# Patient Record
Sex: Female | Born: 1955
Health system: Southern US, Community
[De-identification: ages and names within clinical notes are randomized; demographics above are authoritative.]

## PROBLEM LIST (undated history)

## (undated) DIAGNOSIS — N289 Disorder of kidney and ureter, unspecified: Secondary | ICD-10-CM

---

## 2019-11-24 ENCOUNTER — Other Ambulatory Visit: Payer: Self-pay | Admitting: Internal Medicine

## 2019-11-24 DIAGNOSIS — R030 Elevated blood-pressure reading, without diagnosis of hypertension: Secondary | ICD-10-CM | POA: Diagnosis not present

## 2019-11-24 DIAGNOSIS — Z1231 Encounter for screening mammogram for malignant neoplasm of breast: Secondary | ICD-10-CM | POA: Diagnosis not present

## 2019-11-24 DIAGNOSIS — Z1159 Encounter for screening for other viral diseases: Secondary | ICD-10-CM | POA: Diagnosis not present

## 2019-11-24 DIAGNOSIS — Z114 Encounter for screening for human immunodeficiency virus [HIV]: Secondary | ICD-10-CM | POA: Diagnosis not present

## 2019-11-24 DIAGNOSIS — Z1239 Encounter for other screening for malignant neoplasm of breast: Secondary | ICD-10-CM | POA: Diagnosis not present

## 2019-12-13 ENCOUNTER — Other Ambulatory Visit: Payer: Self-pay

## 2019-12-13 ENCOUNTER — Ambulatory Visit
Admission: RE | Admit: 2019-12-13 | Discharge: 2019-12-13 | Disposition: A | Discharge status: Home / Self Care | Payer: Medicare HMO | Source: Ambulatory Visit | Attending: Internal Medicine | Admitting: Internal Medicine

## 2019-12-13 ENCOUNTER — Encounter (INDEPENDENT_AMBULATORY_CARE_PROVIDER_SITE_OTHER): Payer: Self-pay

## 2019-12-13 DIAGNOSIS — Z1231 Encounter for screening mammogram for malignant neoplasm of breast: Secondary | ICD-10-CM | POA: Diagnosis not present

## 2020-03-08 DIAGNOSIS — Z1211 Encounter for screening for malignant neoplasm of colon: Secondary | ICD-10-CM | POA: Diagnosis not present

## 2020-03-08 DIAGNOSIS — Z0000 Encounter for general adult medical examination without abnormal findings: Secondary | ICD-10-CM | POA: Diagnosis not present

## 2020-03-08 DIAGNOSIS — R030 Elevated blood-pressure reading, without diagnosis of hypertension: Secondary | ICD-10-CM | POA: Diagnosis not present

## 2020-03-08 DIAGNOSIS — Z23 Encounter for immunization: Secondary | ICD-10-CM | POA: Diagnosis not present

## 2020-03-08 DIAGNOSIS — M170 Bilateral primary osteoarthritis of knee: Secondary | ICD-10-CM | POA: Diagnosis not present

## 2020-04-02 DIAGNOSIS — L853 Xerosis cutis: Secondary | ICD-10-CM | POA: Diagnosis not present

## 2020-04-02 DIAGNOSIS — R600 Localized edema: Secondary | ICD-10-CM | POA: Diagnosis not present

## 2020-05-03 DIAGNOSIS — T7840XA Allergy, unspecified, initial encounter: Secondary | ICD-10-CM | POA: Diagnosis not present

## 2020-05-03 DIAGNOSIS — L219 Seborrheic dermatitis, unspecified: Secondary | ICD-10-CM | POA: Diagnosis not present

## 2020-05-07 DIAGNOSIS — M170 Bilateral primary osteoarthritis of knee: Secondary | ICD-10-CM | POA: Insufficient documentation

## 2021-03-11 DIAGNOSIS — M170 Bilateral primary osteoarthritis of knee: Secondary | ICD-10-CM | POA: Diagnosis not present

## 2021-03-11 DIAGNOSIS — Z13820 Encounter for screening for osteoporosis: Secondary | ICD-10-CM | POA: Diagnosis not present

## 2021-03-11 DIAGNOSIS — Z1321 Encounter for screening for nutritional disorder: Secondary | ICD-10-CM | POA: Diagnosis not present

## 2021-03-11 DIAGNOSIS — Z1329 Encounter for screening for other suspected endocrine disorder: Secondary | ICD-10-CM | POA: Diagnosis not present

## 2021-03-11 DIAGNOSIS — Z131 Encounter for screening for diabetes mellitus: Secondary | ICD-10-CM | POA: Diagnosis not present

## 2021-03-11 DIAGNOSIS — Z0000 Encounter for general adult medical examination without abnormal findings: Secondary | ICD-10-CM | POA: Diagnosis not present

## 2021-03-11 DIAGNOSIS — Z23 Encounter for immunization: Secondary | ICD-10-CM | POA: Diagnosis not present

## 2021-03-11 DIAGNOSIS — Z13220 Encounter for screening for lipoid disorders: Secondary | ICD-10-CM | POA: Diagnosis not present

## 2021-04-25 DIAGNOSIS — R7989 Other specified abnormal findings of blood chemistry: Secondary | ICD-10-CM | POA: Diagnosis not present

## 2021-05-21 ENCOUNTER — Other Ambulatory Visit: Payer: Self-pay

## 2021-05-21 ENCOUNTER — Ambulatory Visit
Admission: EM | Admit: 2021-05-21 | Discharge: 2021-05-21 | Disposition: A | Discharge status: Home / Self Care | Payer: Medicare HMO | Attending: Emergency Medicine | Admitting: Emergency Medicine

## 2021-05-21 DIAGNOSIS — T3110 Burns involving 10-19% of body surface with 0% to 9% third degree burns: Secondary | ICD-10-CM

## 2021-05-21 MED ORDER — SILVER SULFADIAZINE 1 % EX CREA: 1.0000 "application " | TOPICAL_CREAM | Freq: Every day | CUTANEOUS | 0 refills | Status: DC

## 2021-05-21 MED ORDER — CEPHALEXIN 500 MG PO CAPS: 500.0000 mg | ORAL_CAPSULE | Freq: Four times a day (QID) | ORAL | 0 refills | Status: DC

## 2021-05-21 MED ORDER — SILVER SULFADIAZINE 1 % EX CREA
TOPICAL_CREAM | Freq: Two times a day (BID) | CUTANEOUS | Status: DC
Start: 2021-05-21 — End: 2021-05-21

## 2021-05-21 NOTE — Discharge Instructions (Signed)
We discussed that you need to follow-up with your PCP in the next 24 to 48 hours for follow-up of wound care.  We will place you on an antibiotic to prevent any infection You will need to wash and dry area well apply Silvadene cream a thin layer twice a day and keep area covered. Do not pop the blisters as these may drain on their own If symptoms become worse you will need to be seen in the emergency room
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
## 2021-05-23 DIAGNOSIS — T24211D Burn of second degree of right thigh, subsequent encounter: Secondary | ICD-10-CM | POA: Diagnosis not present

## 2021-05-23 DIAGNOSIS — Z13820 Encounter for screening for osteoporosis: Secondary | ICD-10-CM | POA: Diagnosis not present

## 2021-05-23 DIAGNOSIS — T24212D Burn of second degree of left thigh, subsequent encounter: Secondary | ICD-10-CM | POA: Diagnosis not present

## 2021-05-30 DIAGNOSIS — T24211D Burn of second degree of right thigh, subsequent encounter: Secondary | ICD-10-CM | POA: Diagnosis not present

## 2021-05-30 DIAGNOSIS — T24212D Burn of second degree of left thigh, subsequent encounter: Secondary | ICD-10-CM | POA: Diagnosis not present

## 2021-06-06 DIAGNOSIS — T24212D Burn of second degree of left thigh, subsequent encounter: Secondary | ICD-10-CM | POA: Diagnosis not present

## 2021-06-06 DIAGNOSIS — Z5189 Encounter for other specified aftercare: Secondary | ICD-10-CM | POA: Diagnosis not present

## 2021-06-06 DIAGNOSIS — T24211D Burn of second degree of right thigh, subsequent encounter: Secondary | ICD-10-CM | POA: Diagnosis not present

## 2021-06-13 DIAGNOSIS — T24211D Burn of second degree of right thigh, subsequent encounter: Secondary | ICD-10-CM | POA: Diagnosis not present

## 2021-06-13 DIAGNOSIS — T24212D Burn of second degree of left thigh, subsequent encounter: Secondary | ICD-10-CM | POA: Diagnosis not present

## 2021-06-13 DIAGNOSIS — Z5189 Encounter for other specified aftercare: Secondary | ICD-10-CM | POA: Diagnosis not present

## 2021-06-25 DIAGNOSIS — R7989 Other specified abnormal findings of blood chemistry: Secondary | ICD-10-CM | POA: Diagnosis not present

## 2021-06-25 DIAGNOSIS — T24211A Burn of second degree of right thigh, initial encounter: Secondary | ICD-10-CM | POA: Diagnosis not present

## 2021-06-25 DIAGNOSIS — Z5189 Encounter for other specified aftercare: Secondary | ICD-10-CM | POA: Diagnosis not present

## 2021-08-01 DIAGNOSIS — M8588 Other specified disorders of bone density and structure, other site: Secondary | ICD-10-CM | POA: Diagnosis not present

## 2021-08-04 DIAGNOSIS — M8580 Other specified disorders of bone density and structure, unspecified site: Secondary | ICD-10-CM | POA: Insufficient documentation

## 2021-12-05 ENCOUNTER — Other Ambulatory Visit: Payer: Self-pay | Admitting: Gerontology

## 2021-12-05 DIAGNOSIS — Z1231 Encounter for screening mammogram for malignant neoplasm of breast: Secondary | ICD-10-CM

## 2021-12-20 IMAGING — MG DIGITAL SCREENING BILAT W/ TOMO W/ CAD
8 series · 8 of 24 positions shown · non-contrast
Comparison: None.

CLINICAL DATA: Screening.

EXAM:
DIGITAL SCREENING BILATERAL MAMMOGRAM WITH TOMO AND CAD

[L MLO synth-2D]
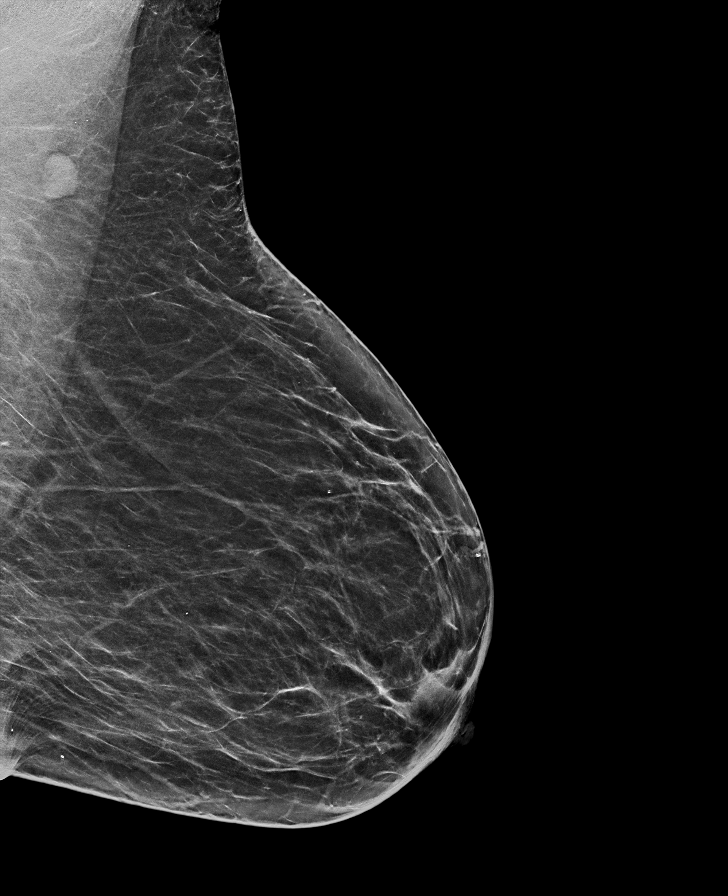

[L CC synth-2D]
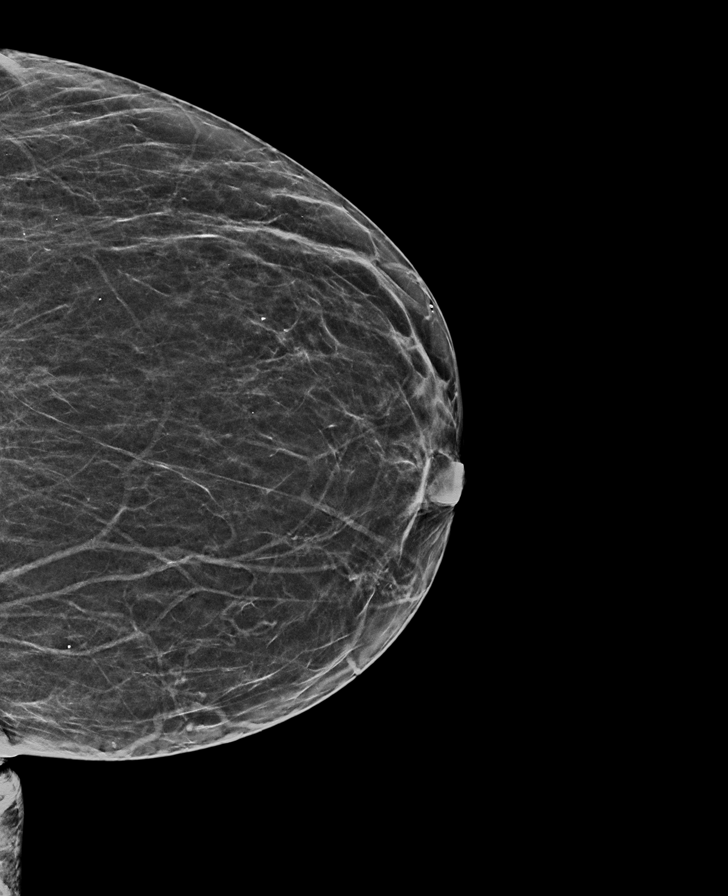

[R MLO synth-2D]
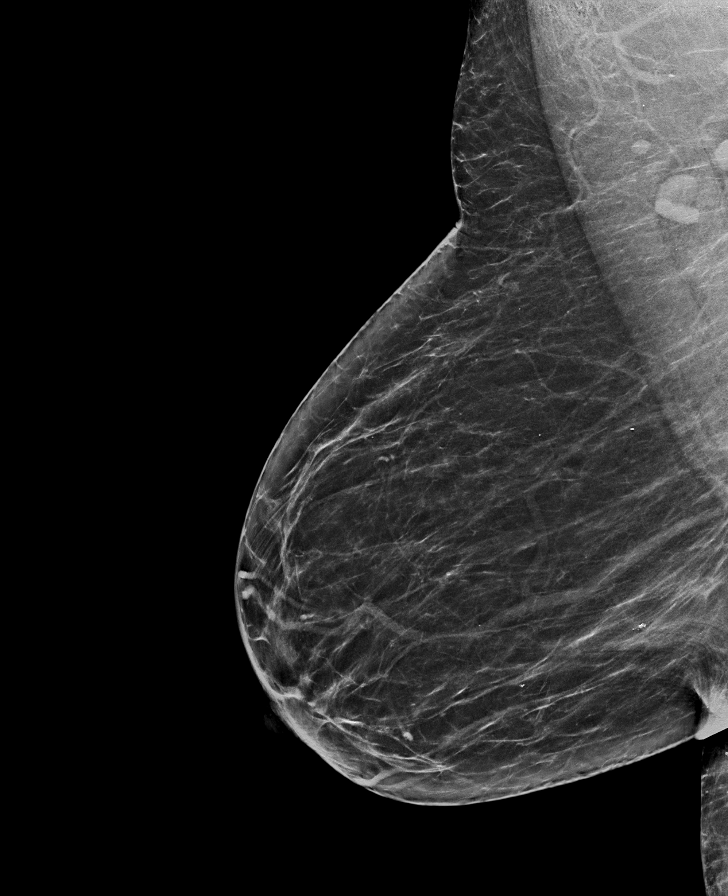

[R CC synth-2D]
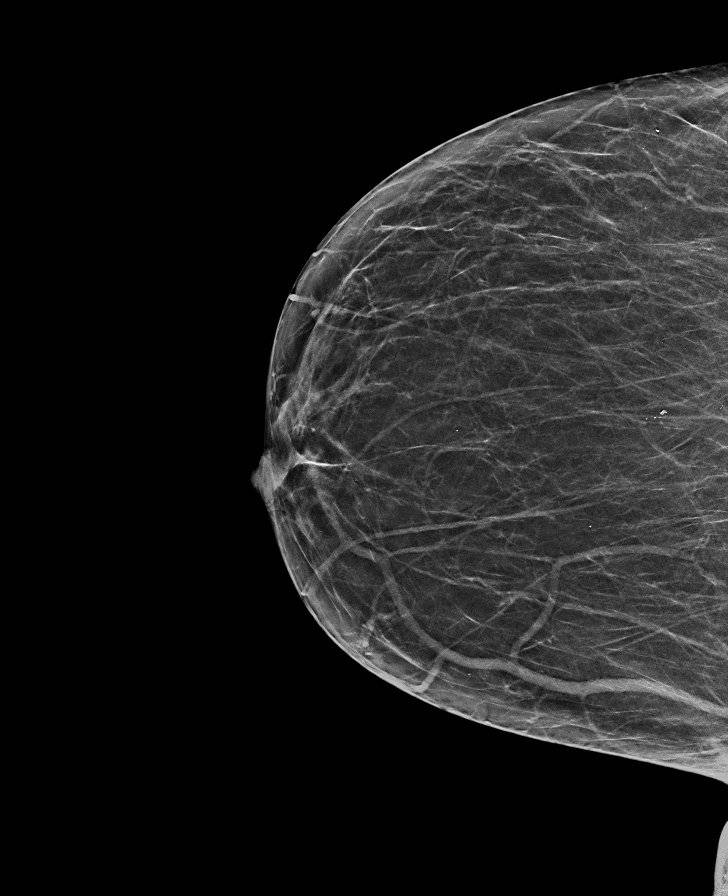

[L MLO tomo · tomo slice 34/67.0]
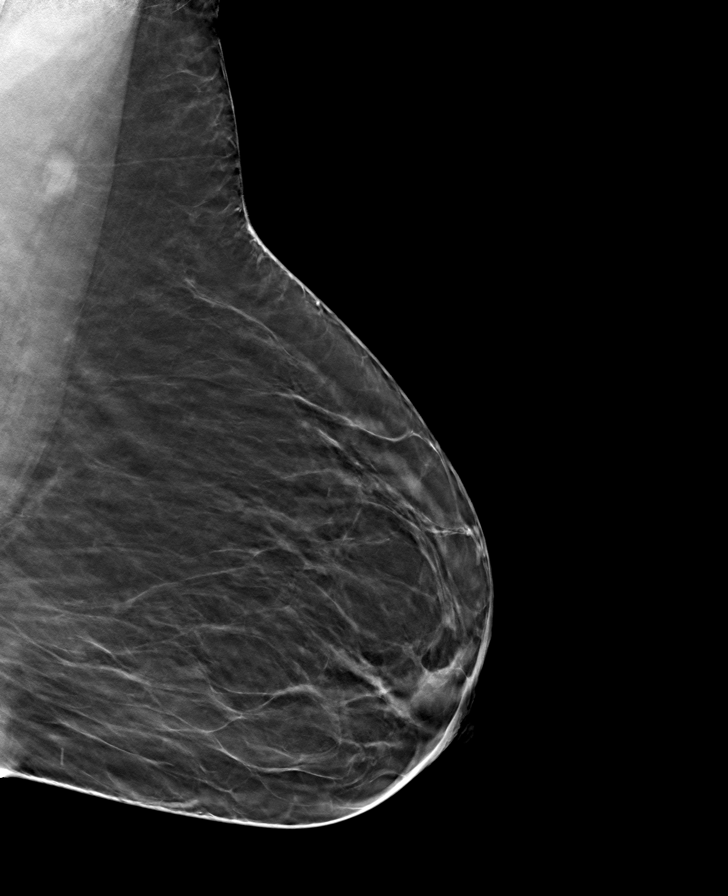

[R MLO tomo · tomo slice 36/71.0]
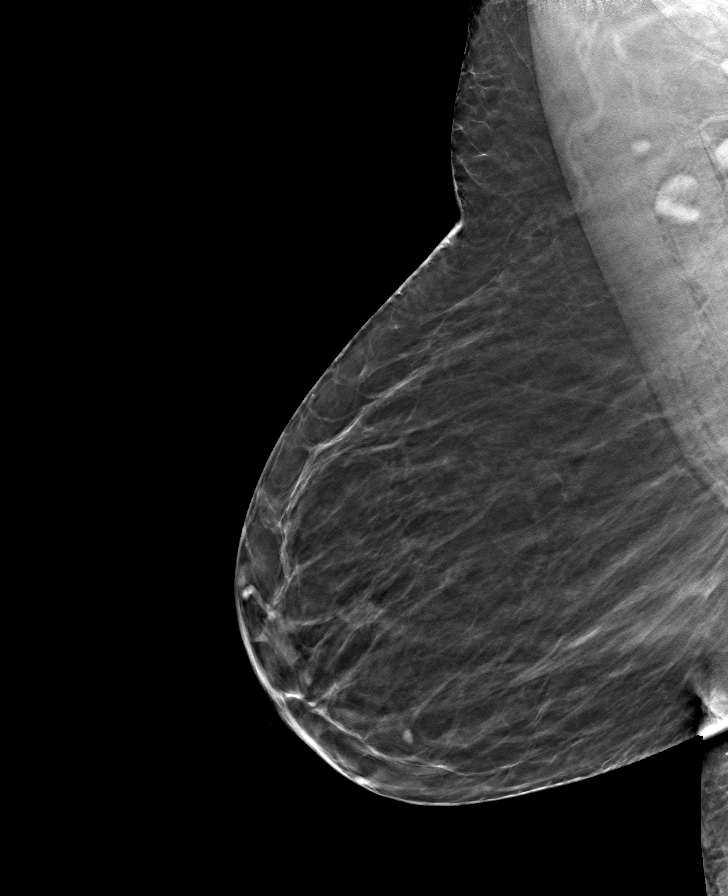

[R CC tomo · tomo slice 29/56.0]
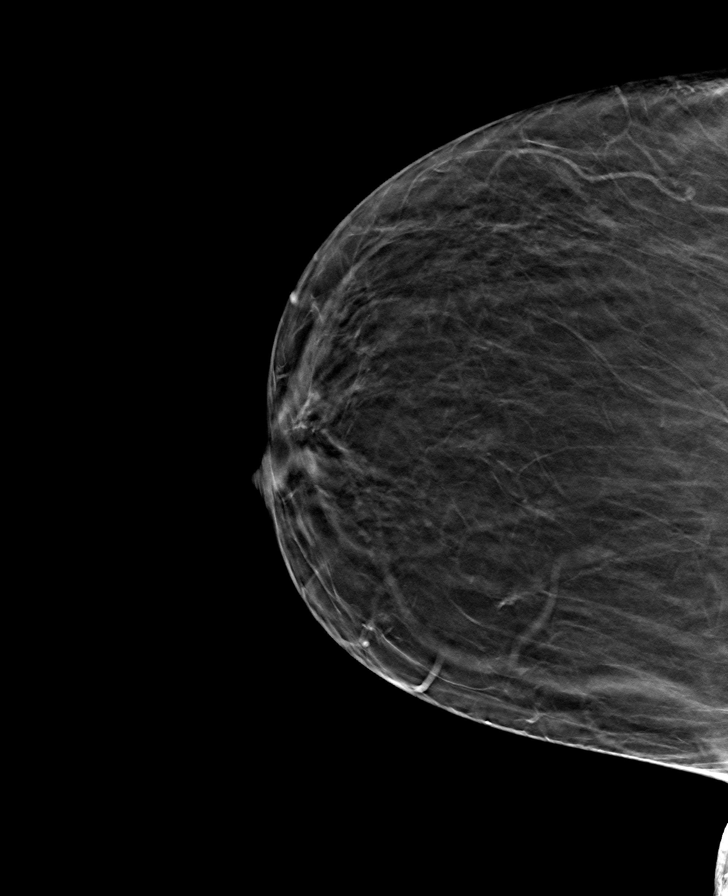

[L CC tomo · tomo slice 28/55.0]
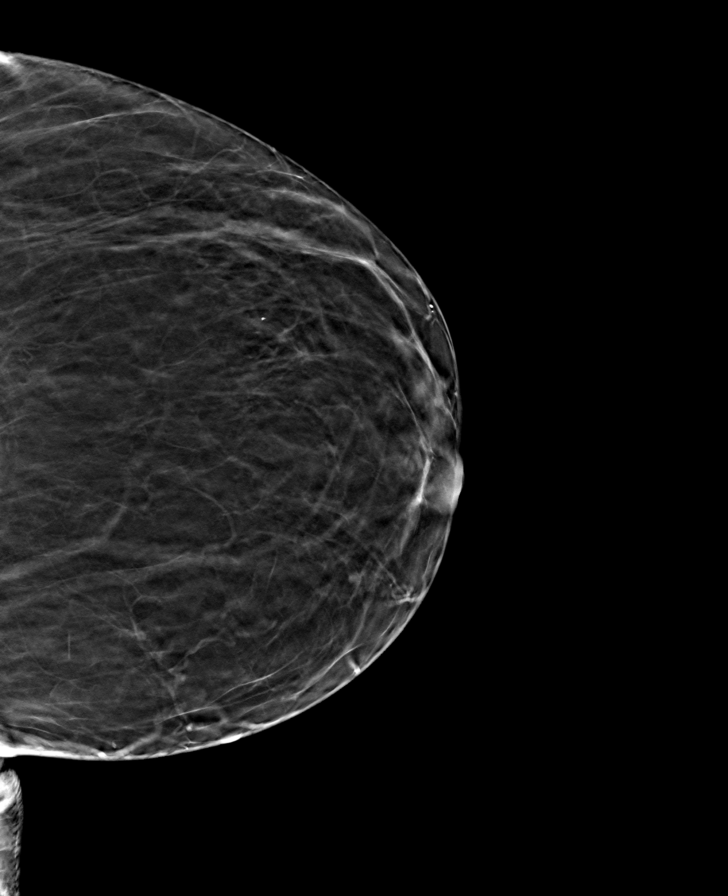

[8 of 24 positions shown; findings below may reference images not displayed]

ACR Breast Density Category b: There are scattered areas of
fibroglandular density.
FINDINGS: There are no findings suspicious for malignancy. Images were
processed with CAD.
IMPRESSION: No mammographic evidence of malignancy. A result letter of this
screening mammogram will be mailed directly to the patient.

RECOMMENDATION:
Screening mammogram in one year. (Code:Y5-G-EJ6)

BI-RADS CATEGORY  1: Negative.

## 2021-12-31 ENCOUNTER — Ambulatory Visit
Admission: RE | Admit: 2021-12-31 | Discharge: 2021-12-31 | Disposition: A | Discharge status: Home / Self Care | Payer: Medicare HMO | Source: Ambulatory Visit | Attending: Gerontology | Admitting: Gerontology

## 2021-12-31 DIAGNOSIS — Z1231 Encounter for screening mammogram for malignant neoplasm of breast: Secondary | ICD-10-CM | POA: Insufficient documentation

## 2022-09-25 DIAGNOSIS — Z1231 Encounter for screening mammogram for malignant neoplasm of breast: Secondary | ICD-10-CM | POA: Diagnosis not present

## 2022-09-25 DIAGNOSIS — Z131 Encounter for screening for diabetes mellitus: Secondary | ICD-10-CM | POA: Diagnosis not present

## 2022-09-25 DIAGNOSIS — Z13220 Encounter for screening for lipoid disorders: Secondary | ICD-10-CM | POA: Diagnosis not present

## 2022-09-25 DIAGNOSIS — M170 Bilateral primary osteoarthritis of knee: Secondary | ICD-10-CM | POA: Diagnosis not present

## 2022-09-25 DIAGNOSIS — M8588 Other specified disorders of bone density and structure, other site: Secondary | ICD-10-CM | POA: Diagnosis not present

## 2022-09-25 DIAGNOSIS — Z1329 Encounter for screening for other suspected endocrine disorder: Secondary | ICD-10-CM | POA: Diagnosis not present

## 2022-09-25 DIAGNOSIS — M8580 Other specified disorders of bone density and structure, unspecified site: Secondary | ICD-10-CM | POA: Diagnosis not present

## 2022-09-25 DIAGNOSIS — Z0000 Encounter for general adult medical examination without abnormal findings: Secondary | ICD-10-CM | POA: Diagnosis not present

## 2022-12-29 ENCOUNTER — Other Ambulatory Visit: Payer: Self-pay | Admitting: Gerontology

## 2022-12-29 DIAGNOSIS — Z1231 Encounter for screening mammogram for malignant neoplasm of breast: Secondary | ICD-10-CM

## 2023-01-28 ENCOUNTER — Ambulatory Visit
Admission: RE | Admit: 2023-01-28 | Discharge: 2023-01-28 | Disposition: A | Discharge status: Home / Self Care | Payer: Medicare HMO | Source: Ambulatory Visit | Attending: Gerontology | Admitting: Gerontology

## 2023-01-28 DIAGNOSIS — Z1231 Encounter for screening mammogram for malignant neoplasm of breast: Secondary | ICD-10-CM | POA: Insufficient documentation

## 2023-07-22 DIAGNOSIS — H524 Presbyopia: Secondary | ICD-10-CM | POA: Diagnosis not present

## 2023-10-01 DIAGNOSIS — M170 Bilateral primary osteoarthritis of knee: Secondary | ICD-10-CM | POA: Diagnosis not present

## 2023-10-01 DIAGNOSIS — Z13220 Encounter for screening for lipoid disorders: Secondary | ICD-10-CM | POA: Diagnosis not present

## 2023-10-01 DIAGNOSIS — Z1329 Encounter for screening for other suspected endocrine disorder: Secondary | ICD-10-CM | POA: Diagnosis not present

## 2023-10-01 DIAGNOSIS — R234 Changes in skin texture: Secondary | ICD-10-CM | POA: Insufficient documentation

## 2023-10-01 DIAGNOSIS — M8580 Other specified disorders of bone density and structure, unspecified site: Secondary | ICD-10-CM | POA: Diagnosis not present

## 2023-10-01 DIAGNOSIS — Z131 Encounter for screening for diabetes mellitus: Secondary | ICD-10-CM | POA: Diagnosis not present

## 2023-10-01 DIAGNOSIS — K649 Unspecified hemorrhoids: Secondary | ICD-10-CM | POA: Diagnosis not present

## 2023-10-01 DIAGNOSIS — Z1331 Encounter for screening for depression: Secondary | ICD-10-CM | POA: Diagnosis not present

## 2023-10-01 DIAGNOSIS — Z0000 Encounter for general adult medical examination without abnormal findings: Secondary | ICD-10-CM | POA: Diagnosis not present

## 2023-10-13 DIAGNOSIS — Z1329 Encounter for screening for other suspected endocrine disorder: Secondary | ICD-10-CM | POA: Diagnosis not present

## 2023-10-13 DIAGNOSIS — Z13220 Encounter for screening for lipoid disorders: Secondary | ICD-10-CM | POA: Diagnosis not present

## 2023-10-13 DIAGNOSIS — M8580 Other specified disorders of bone density and structure, unspecified site: Secondary | ICD-10-CM | POA: Diagnosis not present

## 2023-10-13 DIAGNOSIS — Z131 Encounter for screening for diabetes mellitus: Secondary | ICD-10-CM | POA: Diagnosis not present

## 2023-12-26 DIAGNOSIS — R279 Unspecified lack of coordination: Secondary | ICD-10-CM | POA: Diagnosis not present

## 2023-12-26 DIAGNOSIS — E538 Deficiency of other specified B group vitamins: Secondary | ICD-10-CM | POA: Diagnosis not present

## 2023-12-26 DIAGNOSIS — G8929 Other chronic pain: Secondary | ICD-10-CM | POA: Diagnosis not present

## 2023-12-26 DIAGNOSIS — M6281 Muscle weakness (generalized): Secondary | ICD-10-CM | POA: Diagnosis not present

## 2023-12-26 DIAGNOSIS — M25551 Pain in right hip: Secondary | ICD-10-CM | POA: Insufficient documentation

## 2023-12-26 DIAGNOSIS — R41841 Cognitive communication deficit: Secondary | ICD-10-CM | POA: Diagnosis not present

## 2023-12-26 DIAGNOSIS — R609 Edema, unspecified: Secondary | ICD-10-CM | POA: Diagnosis not present

## 2023-12-26 DIAGNOSIS — B9620 Unspecified Escherichia coli [E. coli] as the cause of diseases classified elsewhere: Secondary | ICD-10-CM | POA: Diagnosis not present

## 2023-12-26 DIAGNOSIS — R269 Unspecified abnormalities of gait and mobility: Secondary | ICD-10-CM | POA: Diagnosis not present

## 2023-12-26 DIAGNOSIS — M79604 Pain in right leg: Secondary | ICD-10-CM | POA: Diagnosis not present

## 2023-12-26 DIAGNOSIS — N3001 Acute cystitis with hematuria: Secondary | ICD-10-CM | POA: Diagnosis not present

## 2023-12-26 DIAGNOSIS — R262 Difficulty in walking, not elsewhere classified: Secondary | ICD-10-CM | POA: Diagnosis not present

## 2023-12-26 DIAGNOSIS — R5381 Other malaise: Secondary | ICD-10-CM | POA: Diagnosis not present

## 2023-12-26 DIAGNOSIS — N390 Urinary tract infection, site not specified: Secondary | ICD-10-CM | POA: Diagnosis not present

## 2023-12-26 DIAGNOSIS — Z1152 Encounter for screening for COVID-19: Secondary | ICD-10-CM | POA: Diagnosis not present

## 2023-12-26 DIAGNOSIS — K5903 Drug induced constipation: Secondary | ICD-10-CM | POA: Diagnosis not present

## 2023-12-26 DIAGNOSIS — D72829 Elevated white blood cell count, unspecified: Secondary | ICD-10-CM | POA: Diagnosis not present

## 2023-12-26 DIAGNOSIS — N3000 Acute cystitis without hematuria: Secondary | ICD-10-CM | POA: Diagnosis not present

## 2023-12-26 DIAGNOSIS — R1311 Dysphagia, oral phase: Secondary | ICD-10-CM | POA: Diagnosis not present

## 2023-12-26 DIAGNOSIS — B356 Tinea cruris: Secondary | ICD-10-CM | POA: Diagnosis not present

## 2023-12-26 DIAGNOSIS — M1611 Unilateral primary osteoarthritis, right hip: Secondary | ICD-10-CM | POA: Diagnosis not present

## 2023-12-27 DIAGNOSIS — R609 Edema, unspecified: Secondary | ICD-10-CM | POA: Diagnosis not present

## 2023-12-27 DIAGNOSIS — D72829 Elevated white blood cell count, unspecified: Secondary | ICD-10-CM | POA: Diagnosis not present

## 2023-12-27 DIAGNOSIS — M25551 Pain in right hip: Secondary | ICD-10-CM | POA: Diagnosis not present

## 2023-12-27 DIAGNOSIS — M1611 Unilateral primary osteoarthritis, right hip: Secondary | ICD-10-CM | POA: Diagnosis not present

## 2023-12-28 DIAGNOSIS — M25551 Pain in right hip: Secondary | ICD-10-CM | POA: Diagnosis not present

## 2023-12-28 DIAGNOSIS — N390 Urinary tract infection, site not specified: Secondary | ICD-10-CM | POA: Diagnosis not present

## 2023-12-28 DIAGNOSIS — D72829 Elevated white blood cell count, unspecified: Secondary | ICD-10-CM | POA: Diagnosis not present

## 2023-12-29 DIAGNOSIS — M79604 Pain in right leg: Secondary | ICD-10-CM | POA: Diagnosis not present

## 2023-12-29 DIAGNOSIS — D72829 Elevated white blood cell count, unspecified: Secondary | ICD-10-CM | POA: Diagnosis not present

## 2023-12-29 DIAGNOSIS — M25551 Pain in right hip: Secondary | ICD-10-CM | POA: Diagnosis not present

## 2023-12-30 DIAGNOSIS — M25551 Pain in right hip: Secondary | ICD-10-CM | POA: Diagnosis not present

## 2023-12-31 DIAGNOSIS — M1611 Unilateral primary osteoarthritis, right hip: Secondary | ICD-10-CM | POA: Diagnosis not present

## 2023-12-31 DIAGNOSIS — M25551 Pain in right hip: Secondary | ICD-10-CM | POA: Diagnosis not present

## 2024-01-01 DIAGNOSIS — M25551 Pain in right hip: Secondary | ICD-10-CM | POA: Diagnosis not present

## 2024-01-02 DIAGNOSIS — M25551 Pain in right hip: Secondary | ICD-10-CM | POA: Diagnosis not present

## 2024-01-03 DIAGNOSIS — M25551 Pain in right hip: Secondary | ICD-10-CM | POA: Diagnosis not present

## 2024-01-03 DIAGNOSIS — D72829 Elevated white blood cell count, unspecified: Secondary | ICD-10-CM | POA: Diagnosis not present

## 2024-01-03 DIAGNOSIS — G8929 Other chronic pain: Secondary | ICD-10-CM | POA: Diagnosis not present

## 2024-01-04 DIAGNOSIS — D72829 Elevated white blood cell count, unspecified: Secondary | ICD-10-CM | POA: Diagnosis not present

## 2024-01-04 DIAGNOSIS — G8929 Other chronic pain: Secondary | ICD-10-CM | POA: Diagnosis not present

## 2024-01-04 DIAGNOSIS — M25551 Pain in right hip: Secondary | ICD-10-CM | POA: Diagnosis not present

## 2024-01-05 DIAGNOSIS — D72829 Elevated white blood cell count, unspecified: Secondary | ICD-10-CM | POA: Diagnosis not present

## 2024-01-05 DIAGNOSIS — G8929 Other chronic pain: Secondary | ICD-10-CM | POA: Diagnosis not present

## 2024-01-05 DIAGNOSIS — M25551 Pain in right hip: Secondary | ICD-10-CM | POA: Diagnosis not present

## 2024-01-06 DIAGNOSIS — E559 Vitamin D deficiency, unspecified: Secondary | ICD-10-CM | POA: Diagnosis not present

## 2024-01-06 DIAGNOSIS — R279 Unspecified lack of coordination: Secondary | ICD-10-CM | POA: Diagnosis not present

## 2024-01-06 DIAGNOSIS — R1311 Dysphagia, oral phase: Secondary | ICD-10-CM | POA: Diagnosis not present

## 2024-01-06 DIAGNOSIS — R5381 Other malaise: Secondary | ICD-10-CM | POA: Diagnosis not present

## 2024-01-06 DIAGNOSIS — M1611 Unilateral primary osteoarthritis, right hip: Secondary | ICD-10-CM | POA: Diagnosis not present

## 2024-01-06 DIAGNOSIS — R262 Difficulty in walking, not elsewhere classified: Secondary | ICD-10-CM | POA: Diagnosis not present

## 2024-01-06 DIAGNOSIS — Z7189 Other specified counseling: Secondary | ICD-10-CM | POA: Diagnosis not present

## 2024-01-06 DIAGNOSIS — G4700 Insomnia, unspecified: Secondary | ICD-10-CM | POA: Diagnosis not present

## 2024-01-06 DIAGNOSIS — K5900 Constipation, unspecified: Secondary | ICD-10-CM | POA: Diagnosis not present

## 2024-01-06 DIAGNOSIS — R269 Unspecified abnormalities of gait and mobility: Secondary | ICD-10-CM | POA: Diagnosis not present

## 2024-01-06 DIAGNOSIS — Z87448 Personal history of other diseases of urinary system: Secondary | ICD-10-CM | POA: Diagnosis not present

## 2024-01-06 DIAGNOSIS — E538 Deficiency of other specified B group vitamins: Secondary | ICD-10-CM | POA: Diagnosis not present

## 2024-01-06 DIAGNOSIS — D72829 Elevated white blood cell count, unspecified: Secondary | ICD-10-CM | POA: Diagnosis not present

## 2024-01-06 DIAGNOSIS — R41841 Cognitive communication deficit: Secondary | ICD-10-CM | POA: Diagnosis not present

## 2024-01-06 DIAGNOSIS — B356 Tinea cruris: Secondary | ICD-10-CM | POA: Diagnosis not present

## 2024-01-06 DIAGNOSIS — N390 Urinary tract infection, site not specified: Secondary | ICD-10-CM | POA: Diagnosis not present

## 2024-01-06 DIAGNOSIS — M6281 Muscle weakness (generalized): Secondary | ICD-10-CM | POA: Diagnosis not present

## 2024-01-06 DIAGNOSIS — M25551 Pain in right hip: Secondary | ICD-10-CM | POA: Diagnosis not present

## 2024-01-06 DIAGNOSIS — N3000 Acute cystitis without hematuria: Secondary | ICD-10-CM | POA: Diagnosis not present

## 2024-01-07 DIAGNOSIS — Z7189 Other specified counseling: Secondary | ICD-10-CM | POA: Diagnosis not present

## 2024-01-07 DIAGNOSIS — N3000 Acute cystitis without hematuria: Secondary | ICD-10-CM | POA: Diagnosis not present

## 2024-01-07 DIAGNOSIS — K5900 Constipation, unspecified: Secondary | ICD-10-CM | POA: Diagnosis not present

## 2024-01-07 DIAGNOSIS — M1611 Unilateral primary osteoarthritis, right hip: Secondary | ICD-10-CM | POA: Diagnosis not present

## 2024-01-11 DIAGNOSIS — E559 Vitamin D deficiency, unspecified: Secondary | ICD-10-CM | POA: Diagnosis not present

## 2024-01-11 DIAGNOSIS — B356 Tinea cruris: Secondary | ICD-10-CM | POA: Diagnosis not present

## 2024-01-11 DIAGNOSIS — Z87448 Personal history of other diseases of urinary system: Secondary | ICD-10-CM | POA: Diagnosis not present

## 2024-01-11 DIAGNOSIS — R5381 Other malaise: Secondary | ICD-10-CM | POA: Diagnosis not present

## 2024-01-11 DIAGNOSIS — K5900 Constipation, unspecified: Secondary | ICD-10-CM | POA: Diagnosis not present

## 2024-01-11 DIAGNOSIS — E538 Deficiency of other specified B group vitamins: Secondary | ICD-10-CM | POA: Diagnosis not present

## 2024-01-11 DIAGNOSIS — G4700 Insomnia, unspecified: Secondary | ICD-10-CM | POA: Diagnosis not present

## 2024-01-11 DIAGNOSIS — M1611 Unilateral primary osteoarthritis, right hip: Secondary | ICD-10-CM | POA: Diagnosis not present

## 2024-01-21 DIAGNOSIS — Z87448 Personal history of other diseases of urinary system: Secondary | ICD-10-CM | POA: Diagnosis not present

## 2024-01-21 DIAGNOSIS — R5381 Other malaise: Secondary | ICD-10-CM | POA: Diagnosis not present

## 2024-01-21 DIAGNOSIS — M1611 Unilateral primary osteoarthritis, right hip: Secondary | ICD-10-CM | POA: Diagnosis not present

## 2024-01-21 DIAGNOSIS — E538 Deficiency of other specified B group vitamins: Secondary | ICD-10-CM | POA: Diagnosis not present

## 2024-01-26 DIAGNOSIS — B356 Tinea cruris: Secondary | ICD-10-CM | POA: Diagnosis not present

## 2024-01-26 DIAGNOSIS — E559 Vitamin D deficiency, unspecified: Secondary | ICD-10-CM | POA: Diagnosis not present

## 2024-01-26 DIAGNOSIS — L989 Disorder of the skin and subcutaneous tissue, unspecified: Secondary | ICD-10-CM | POA: Diagnosis not present

## 2024-01-26 DIAGNOSIS — G4700 Insomnia, unspecified: Secondary | ICD-10-CM | POA: Diagnosis not present

## 2024-01-26 DIAGNOSIS — B9620 Unspecified Escherichia coli [E. coli] as the cause of diseases classified elsewhere: Secondary | ICD-10-CM | POA: Diagnosis not present

## 2024-01-26 DIAGNOSIS — M160 Bilateral primary osteoarthritis of hip: Secondary | ICD-10-CM | POA: Diagnosis not present

## 2024-01-26 DIAGNOSIS — N3000 Acute cystitis without hematuria: Secondary | ICD-10-CM | POA: Diagnosis not present

## 2024-01-26 DIAGNOSIS — K5900 Constipation, unspecified: Secondary | ICD-10-CM | POA: Diagnosis not present

## 2024-01-26 DIAGNOSIS — E538 Deficiency of other specified B group vitamins: Secondary | ICD-10-CM | POA: Diagnosis not present

## 2024-01-27 DIAGNOSIS — N3000 Acute cystitis without hematuria: Secondary | ICD-10-CM | POA: Diagnosis not present

## 2024-01-31 DIAGNOSIS — G4700 Insomnia, unspecified: Secondary | ICD-10-CM | POA: Diagnosis not present

## 2024-01-31 DIAGNOSIS — B356 Tinea cruris: Secondary | ICD-10-CM | POA: Diagnosis not present

## 2024-01-31 DIAGNOSIS — E559 Vitamin D deficiency, unspecified: Secondary | ICD-10-CM | POA: Diagnosis not present

## 2024-01-31 DIAGNOSIS — B9620 Unspecified Escherichia coli [E. coli] as the cause of diseases classified elsewhere: Secondary | ICD-10-CM | POA: Diagnosis not present

## 2024-01-31 DIAGNOSIS — K5900 Constipation, unspecified: Secondary | ICD-10-CM | POA: Diagnosis not present

## 2024-01-31 DIAGNOSIS — M160 Bilateral primary osteoarthritis of hip: Secondary | ICD-10-CM | POA: Diagnosis not present

## 2024-02-04 DIAGNOSIS — Z87440 Personal history of urinary (tract) infections: Secondary | ICD-10-CM | POA: Diagnosis not present

## 2024-02-04 DIAGNOSIS — L989 Disorder of the skin and subcutaneous tissue, unspecified: Secondary | ICD-10-CM | POA: Diagnosis not present

## 2024-02-04 DIAGNOSIS — E538 Deficiency of other specified B group vitamins: Secondary | ICD-10-CM | POA: Diagnosis not present

## 2024-02-04 DIAGNOSIS — N3000 Acute cystitis without hematuria: Secondary | ICD-10-CM | POA: Diagnosis not present

## 2024-02-04 DIAGNOSIS — B9620 Unspecified Escherichia coli [E. coli] as the cause of diseases classified elsewhere: Secondary | ICD-10-CM | POA: Diagnosis not present

## 2024-02-04 DIAGNOSIS — E559 Vitamin D deficiency, unspecified: Secondary | ICD-10-CM | POA: Diagnosis not present

## 2024-02-04 DIAGNOSIS — Z1331 Encounter for screening for depression: Secondary | ICD-10-CM | POA: Diagnosis not present

## 2024-02-04 DIAGNOSIS — G4700 Insomnia, unspecified: Secondary | ICD-10-CM | POA: Diagnosis not present

## 2024-02-04 DIAGNOSIS — M170 Bilateral primary osteoarthritis of knee: Secondary | ICD-10-CM | POA: Diagnosis not present

## 2024-02-04 DIAGNOSIS — B356 Tinea cruris: Secondary | ICD-10-CM | POA: Diagnosis not present

## 2024-02-04 DIAGNOSIS — M25551 Pain in right hip: Secondary | ICD-10-CM | POA: Diagnosis not present

## 2024-02-04 DIAGNOSIS — M160 Bilateral primary osteoarthritis of hip: Secondary | ICD-10-CM | POA: Diagnosis not present

## 2024-02-04 DIAGNOSIS — Z09 Encounter for follow-up examination after completed treatment for conditions other than malignant neoplasm: Secondary | ICD-10-CM | POA: Diagnosis not present

## 2024-02-04 DIAGNOSIS — K5900 Constipation, unspecified: Secondary | ICD-10-CM | POA: Diagnosis not present

## 2024-02-07 DIAGNOSIS — L989 Disorder of the skin and subcutaneous tissue, unspecified: Secondary | ICD-10-CM | POA: Diagnosis not present

## 2024-02-09 DIAGNOSIS — N3000 Acute cystitis without hematuria: Secondary | ICD-10-CM | POA: Diagnosis not present

## 2024-02-09 DIAGNOSIS — G4700 Insomnia, unspecified: Secondary | ICD-10-CM | POA: Diagnosis not present

## 2024-02-09 DIAGNOSIS — M1611 Unilateral primary osteoarthritis, right hip: Secondary | ICD-10-CM | POA: Diagnosis not present

## 2024-02-09 DIAGNOSIS — E559 Vitamin D deficiency, unspecified: Secondary | ICD-10-CM | POA: Diagnosis not present

## 2024-02-09 DIAGNOSIS — E538 Deficiency of other specified B group vitamins: Secondary | ICD-10-CM | POA: Diagnosis not present

## 2024-02-09 DIAGNOSIS — L989 Disorder of the skin and subcutaneous tissue, unspecified: Secondary | ICD-10-CM | POA: Diagnosis not present

## 2024-02-09 DIAGNOSIS — K5900 Constipation, unspecified: Secondary | ICD-10-CM | POA: Diagnosis not present

## 2024-02-09 DIAGNOSIS — B356 Tinea cruris: Secondary | ICD-10-CM | POA: Diagnosis not present

## 2024-02-09 DIAGNOSIS — M160 Bilateral primary osteoarthritis of hip: Secondary | ICD-10-CM | POA: Diagnosis not present

## 2024-02-09 DIAGNOSIS — B9620 Unspecified Escherichia coli [E. coli] as the cause of diseases classified elsewhere: Secondary | ICD-10-CM | POA: Diagnosis not present

## 2024-02-11 DIAGNOSIS — G4700 Insomnia, unspecified: Secondary | ICD-10-CM | POA: Diagnosis not present

## 2024-02-11 DIAGNOSIS — M160 Bilateral primary osteoarthritis of hip: Secondary | ICD-10-CM | POA: Diagnosis not present

## 2024-02-11 DIAGNOSIS — E538 Deficiency of other specified B group vitamins: Secondary | ICD-10-CM | POA: Diagnosis not present

## 2024-02-11 DIAGNOSIS — N3000 Acute cystitis without hematuria: Secondary | ICD-10-CM | POA: Diagnosis not present

## 2024-02-11 DIAGNOSIS — L989 Disorder of the skin and subcutaneous tissue, unspecified: Secondary | ICD-10-CM | POA: Diagnosis not present

## 2024-02-11 DIAGNOSIS — E559 Vitamin D deficiency, unspecified: Secondary | ICD-10-CM | POA: Diagnosis not present

## 2024-02-11 DIAGNOSIS — B9620 Unspecified Escherichia coli [E. coli] as the cause of diseases classified elsewhere: Secondary | ICD-10-CM | POA: Diagnosis not present

## 2024-02-11 DIAGNOSIS — K5900 Constipation, unspecified: Secondary | ICD-10-CM | POA: Diagnosis not present

## 2024-02-14 DIAGNOSIS — M160 Bilateral primary osteoarthritis of hip: Secondary | ICD-10-CM | POA: Diagnosis not present

## 2024-02-14 DIAGNOSIS — E559 Vitamin D deficiency, unspecified: Secondary | ICD-10-CM | POA: Diagnosis not present

## 2024-02-14 DIAGNOSIS — E538 Deficiency of other specified B group vitamins: Secondary | ICD-10-CM | POA: Diagnosis not present

## 2024-02-14 DIAGNOSIS — K5900 Constipation, unspecified: Secondary | ICD-10-CM | POA: Diagnosis not present

## 2024-02-14 DIAGNOSIS — L989 Disorder of the skin and subcutaneous tissue, unspecified: Secondary | ICD-10-CM | POA: Diagnosis not present

## 2024-02-14 DIAGNOSIS — B9620 Unspecified Escherichia coli [E. coli] as the cause of diseases classified elsewhere: Secondary | ICD-10-CM | POA: Diagnosis not present

## 2024-02-14 DIAGNOSIS — R1310 Dysphagia, unspecified: Secondary | ICD-10-CM | POA: Diagnosis not present

## 2024-02-14 DIAGNOSIS — Z5982 Transportation insecurity: Secondary | ICD-10-CM | POA: Diagnosis not present

## 2024-02-14 DIAGNOSIS — N3000 Acute cystitis without hematuria: Secondary | ICD-10-CM | POA: Diagnosis not present

## 2024-02-14 DIAGNOSIS — B356 Tinea cruris: Secondary | ICD-10-CM | POA: Diagnosis not present

## 2024-02-14 DIAGNOSIS — G4700 Insomnia, unspecified: Secondary | ICD-10-CM | POA: Diagnosis not present

## 2024-02-15 DIAGNOSIS — N3000 Acute cystitis without hematuria: Secondary | ICD-10-CM | POA: Diagnosis not present

## 2024-02-15 DIAGNOSIS — G4700 Insomnia, unspecified: Secondary | ICD-10-CM | POA: Diagnosis not present

## 2024-02-15 DIAGNOSIS — B9620 Unspecified Escherichia coli [E. coli] as the cause of diseases classified elsewhere: Secondary | ICD-10-CM | POA: Diagnosis not present

## 2024-02-15 DIAGNOSIS — B356 Tinea cruris: Secondary | ICD-10-CM | POA: Diagnosis not present

## 2024-02-15 DIAGNOSIS — L989 Disorder of the skin and subcutaneous tissue, unspecified: Secondary | ICD-10-CM | POA: Diagnosis not present

## 2024-02-15 DIAGNOSIS — M160 Bilateral primary osteoarthritis of hip: Secondary | ICD-10-CM | POA: Diagnosis not present

## 2024-02-15 DIAGNOSIS — E559 Vitamin D deficiency, unspecified: Secondary | ICD-10-CM | POA: Diagnosis not present

## 2024-02-15 DIAGNOSIS — K5900 Constipation, unspecified: Secondary | ICD-10-CM | POA: Diagnosis not present

## 2024-02-15 DIAGNOSIS — E538 Deficiency of other specified B group vitamins: Secondary | ICD-10-CM | POA: Diagnosis not present

## 2024-02-17 DIAGNOSIS — L989 Disorder of the skin and subcutaneous tissue, unspecified: Secondary | ICD-10-CM | POA: Diagnosis not present

## 2024-02-17 DIAGNOSIS — G4700 Insomnia, unspecified: Secondary | ICD-10-CM | POA: Diagnosis not present

## 2024-02-17 DIAGNOSIS — E538 Deficiency of other specified B group vitamins: Secondary | ICD-10-CM | POA: Diagnosis not present

## 2024-02-17 DIAGNOSIS — M160 Bilateral primary osteoarthritis of hip: Secondary | ICD-10-CM | POA: Diagnosis not present

## 2024-02-17 DIAGNOSIS — N3000 Acute cystitis without hematuria: Secondary | ICD-10-CM | POA: Diagnosis not present

## 2024-02-17 DIAGNOSIS — K5900 Constipation, unspecified: Secondary | ICD-10-CM | POA: Diagnosis not present

## 2024-02-17 DIAGNOSIS — B9620 Unspecified Escherichia coli [E. coli] as the cause of diseases classified elsewhere: Secondary | ICD-10-CM | POA: Diagnosis not present

## 2024-02-17 DIAGNOSIS — B356 Tinea cruris: Secondary | ICD-10-CM | POA: Diagnosis not present

## 2024-02-17 DIAGNOSIS — E559 Vitamin D deficiency, unspecified: Secondary | ICD-10-CM | POA: Diagnosis not present

## 2024-02-18 DIAGNOSIS — B356 Tinea cruris: Secondary | ICD-10-CM | POA: Diagnosis not present

## 2024-02-21 ENCOUNTER — Ambulatory Visit: Admitting: Orthopedic Surgery

## 2024-02-21 DIAGNOSIS — E559 Vitamin D deficiency, unspecified: Secondary | ICD-10-CM | POA: Diagnosis not present

## 2024-02-21 DIAGNOSIS — B356 Tinea cruris: Secondary | ICD-10-CM | POA: Diagnosis not present

## 2024-02-21 DIAGNOSIS — E538 Deficiency of other specified B group vitamins: Secondary | ICD-10-CM | POA: Diagnosis not present

## 2024-02-21 DIAGNOSIS — B9620 Unspecified Escherichia coli [E. coli] as the cause of diseases classified elsewhere: Secondary | ICD-10-CM | POA: Diagnosis not present

## 2024-02-21 DIAGNOSIS — L989 Disorder of the skin and subcutaneous tissue, unspecified: Secondary | ICD-10-CM | POA: Diagnosis not present

## 2024-02-21 DIAGNOSIS — G4700 Insomnia, unspecified: Secondary | ICD-10-CM | POA: Diagnosis not present

## 2024-02-21 DIAGNOSIS — K5900 Constipation, unspecified: Secondary | ICD-10-CM | POA: Diagnosis not present

## 2024-02-21 DIAGNOSIS — N3000 Acute cystitis without hematuria: Secondary | ICD-10-CM | POA: Diagnosis not present

## 2024-02-21 DIAGNOSIS — M160 Bilateral primary osteoarthritis of hip: Secondary | ICD-10-CM | POA: Diagnosis not present

## 2024-02-22 DIAGNOSIS — B9620 Unspecified Escherichia coli [E. coli] as the cause of diseases classified elsewhere: Secondary | ICD-10-CM | POA: Diagnosis not present

## 2024-02-22 DIAGNOSIS — K5900 Constipation, unspecified: Secondary | ICD-10-CM | POA: Diagnosis not present

## 2024-02-22 DIAGNOSIS — M160 Bilateral primary osteoarthritis of hip: Secondary | ICD-10-CM | POA: Diagnosis not present

## 2024-02-22 DIAGNOSIS — E559 Vitamin D deficiency, unspecified: Secondary | ICD-10-CM | POA: Diagnosis not present

## 2024-02-22 DIAGNOSIS — L989 Disorder of the skin and subcutaneous tissue, unspecified: Secondary | ICD-10-CM | POA: Diagnosis not present

## 2024-02-22 DIAGNOSIS — N3000 Acute cystitis without hematuria: Secondary | ICD-10-CM | POA: Diagnosis not present

## 2024-02-22 DIAGNOSIS — B356 Tinea cruris: Secondary | ICD-10-CM | POA: Diagnosis not present

## 2024-02-22 DIAGNOSIS — E538 Deficiency of other specified B group vitamins: Secondary | ICD-10-CM | POA: Diagnosis not present

## 2024-02-22 DIAGNOSIS — G4700 Insomnia, unspecified: Secondary | ICD-10-CM | POA: Diagnosis not present

## 2024-02-24 DIAGNOSIS — E538 Deficiency of other specified B group vitamins: Secondary | ICD-10-CM | POA: Diagnosis not present

## 2024-02-24 DIAGNOSIS — K5900 Constipation, unspecified: Secondary | ICD-10-CM | POA: Diagnosis not present

## 2024-02-24 DIAGNOSIS — L989 Disorder of the skin and subcutaneous tissue, unspecified: Secondary | ICD-10-CM | POA: Diagnosis not present

## 2024-02-24 DIAGNOSIS — N3000 Acute cystitis without hematuria: Secondary | ICD-10-CM | POA: Diagnosis not present

## 2024-02-24 DIAGNOSIS — M160 Bilateral primary osteoarthritis of hip: Secondary | ICD-10-CM | POA: Diagnosis not present

## 2024-02-24 DIAGNOSIS — E559 Vitamin D deficiency, unspecified: Secondary | ICD-10-CM | POA: Diagnosis not present

## 2024-02-24 DIAGNOSIS — B9620 Unspecified Escherichia coli [E. coli] as the cause of diseases classified elsewhere: Secondary | ICD-10-CM | POA: Diagnosis not present

## 2024-02-24 DIAGNOSIS — B356 Tinea cruris: Secondary | ICD-10-CM | POA: Diagnosis not present

## 2024-02-24 DIAGNOSIS — G4700 Insomnia, unspecified: Secondary | ICD-10-CM | POA: Diagnosis not present

## 2024-02-26 DIAGNOSIS — N202 Calculus of kidney with calculus of ureter: Secondary | ICD-10-CM | POA: Diagnosis not present

## 2024-02-26 DIAGNOSIS — D259 Leiomyoma of uterus, unspecified: Secondary | ICD-10-CM | POA: Diagnosis not present

## 2024-02-26 DIAGNOSIS — N200 Calculus of kidney: Secondary | ICD-10-CM | POA: Diagnosis not present

## 2024-02-26 DIAGNOSIS — N854 Malposition of uterus: Secondary | ICD-10-CM | POA: Diagnosis not present

## 2024-02-26 DIAGNOSIS — R9341 Abnormal radiologic findings on diagnostic imaging of renal pelvis, ureter, or bladder: Secondary | ICD-10-CM | POA: Diagnosis not present

## 2024-03-04 DIAGNOSIS — Z960 Presence of urogenital implants: Secondary | ICD-10-CM | POA: Diagnosis not present

## 2024-03-04 DIAGNOSIS — N200 Calculus of kidney: Secondary | ICD-10-CM | POA: Diagnosis not present

## 2024-03-04 DIAGNOSIS — Z466 Encounter for fitting and adjustment of urinary device: Secondary | ICD-10-CM | POA: Diagnosis not present

## 2024-03-04 DIAGNOSIS — R1084 Generalized abdominal pain: Secondary | ICD-10-CM | POA: Diagnosis not present

## 2024-03-04 DIAGNOSIS — R10A3 Flank pain, bilateral: Secondary | ICD-10-CM | POA: Diagnosis not present

## 2024-03-04 DIAGNOSIS — N1330 Unspecified hydronephrosis: Secondary | ICD-10-CM | POA: Diagnosis not present

## 2024-03-04 DIAGNOSIS — Z79899 Other long term (current) drug therapy: Secondary | ICD-10-CM | POA: Diagnosis not present

## 2024-03-04 DIAGNOSIS — N201 Calculus of ureter: Secondary | ICD-10-CM | POA: Diagnosis not present

## 2024-03-08 DIAGNOSIS — R10A3 Flank pain, bilateral: Secondary | ICD-10-CM | POA: Diagnosis not present

## 2024-03-08 DIAGNOSIS — N3000 Acute cystitis without hematuria: Secondary | ICD-10-CM | POA: Diagnosis not present

## 2024-03-10 DIAGNOSIS — N201 Calculus of ureter: Secondary | ICD-10-CM | POA: Diagnosis not present

## 2024-03-10 DIAGNOSIS — M1990 Unspecified osteoarthritis, unspecified site: Secondary | ICD-10-CM | POA: Diagnosis not present

## 2024-03-10 DIAGNOSIS — N200 Calculus of kidney: Secondary | ICD-10-CM | POA: Diagnosis not present

## 2024-03-10 DIAGNOSIS — R10A3 Flank pain, bilateral: Secondary | ICD-10-CM | POA: Diagnosis not present

## 2024-03-10 DIAGNOSIS — Z79899 Other long term (current) drug therapy: Secondary | ICD-10-CM | POA: Diagnosis not present

## 2024-03-10 DIAGNOSIS — M549 Dorsalgia, unspecified: Secondary | ICD-10-CM | POA: Diagnosis not present

## 2024-03-10 DIAGNOSIS — Z791 Long term (current) use of non-steroidal anti-inflammatories (NSAID): Secondary | ICD-10-CM | POA: Diagnosis not present

## 2024-03-10 DIAGNOSIS — N139 Obstructive and reflux uropathy, unspecified: Secondary | ICD-10-CM | POA: Diagnosis not present

## 2024-03-10 DIAGNOSIS — N281 Cyst of kidney, acquired: Secondary | ICD-10-CM | POA: Diagnosis not present

## 2024-03-11 DIAGNOSIS — N202 Calculus of kidney with calculus of ureter: Secondary | ICD-10-CM | POA: Diagnosis not present

## 2024-03-11 DIAGNOSIS — Z792 Long term (current) use of antibiotics: Secondary | ICD-10-CM | POA: Diagnosis not present

## 2024-03-11 DIAGNOSIS — Z79899 Other long term (current) drug therapy: Secondary | ICD-10-CM | POA: Diagnosis not present

## 2024-03-13 DIAGNOSIS — M8580 Other specified disorders of bone density and structure, unspecified site: Secondary | ICD-10-CM | POA: Diagnosis not present

## 2024-03-13 DIAGNOSIS — M1611 Unilateral primary osteoarthritis, right hip: Secondary | ICD-10-CM | POA: Diagnosis not present

## 2024-03-13 DIAGNOSIS — M48061 Spinal stenosis, lumbar region without neurogenic claudication: Secondary | ICD-10-CM | POA: Diagnosis not present

## 2024-03-16 NOTE — ED Provider Notes (Signed)
 [facility] Emergency Department Provider Note   ED Clinical Impression   Final diagnoses:  Complication of urinary stent, initial encounter (Primary)     Impression, Medical Decision Making, ED Course   9:03 AM  Impression: 68 y.o. female with a past medical history of recurrent cystitis and nephrolithiasis (s/p recent ureteroscopy with lithotripsy and insertion of ureteral stent 03/15/24) who presents with increased length of ureteral stent string following placement yesterday as described below. Upon arrival, VS WNL.  DDx/MDM: 68 year old female presents emergency department with potential postoperative complication.  Vital signs within normal limits.  Patient underwent lithotripsy with a left indwelling ureteral stent placement yesterday.  This morning, she reports noticing increased length of strings that she remembers initially following surgery.  No abdominal pain, nausea, vomiting, fevers, chills, dysuria.  Patient has had continued urinary frequency which is unchanged.  Patient states this is status post be removed in 6 days, on day 7 postop.  Differential diagnosis includes stent dislodgment, migration.  Lower suspicion for infection given no fever, chills, vomiting, urinary symptoms, infectious symptoms.  Plan for UA and XR abdomen.   ED Course as of 03/17/24 1904  Thu Mar 16, 2024  1311 XR shows: Nonobstructive bowel gas pattern. Moderate colonic stool burden. Rectal gas is seen.  1311 Spoke with radiology.  They cannot visualize any stent on x-ray.  1356 Updated patient on findings.  On discussion at this point, she reports since arrival to the emergency department, stent has completely fallen out.  She states has occurred while she was using the restroom.  Currently not complaining of any pain at this time.  Will speak with urology regarding further recommendations given initial recommendation was to have stent in place for 1 week and it was only placed yesterday  1445 Spoke  with urology.  They will call back with recommendations.  28 Spoke with urology who has evaluated patient.  From their perspective, patient is cleared for discharge.  They would recommend outpatient follow-up and strict return precautions for any worsening pain, fevers, vomiting, or any other concerning symptoms.  Return precautions reviewed.    Discussion of Management With Other Providers or Support Staff: I discussed the management of this patient with the: Urology consult  Considerations Regarding Disposition/Escalation of Care and Critical Care:  ____________________________________________  The case was discussed with the attending physician, who is in agreement with the above assessment and plan.    History   Chief Complaint Chief Complaint  Patient presents with   Surgical Problem Re-Evaluation    HPI  Laura Stevens is a 68 y.o. female with past medical history as below who presents with a surgical problem re-evaluation. The patient reports this morning after urinating she noticed the string from her urethral stent that was placed yesterday was visible and increased in length. She states that she was not able to visualize the strings following the procedure yesterday. She also endorses continued increased urinary frequency which has been unchanged. She notes the stent was supposed to be removed by herself after 7 days post-op. She denies denies dysuria, fevers, chills, nausea, vomiting, abdominal pain.  Outside Historian(s): I have obtained additional history/collateral from: none.  External Records Reviewed: I have reviewed 03/15/24 Urology Discharge Summary- Reviewed past medical history and recent ureteroscopy with lithotripsy and insertion of ureteral stent.  Past Medical History[1]  Past Surgical History[2]  No current facility-administered medications for this encounter.  Current Outpatient Medications:    acetaminophen (TYLENOL) 500 MG tablet, Take 2 tablets  (  1,000 mg total) by mouth every eight (8) hours for 7 days., Disp: 42 tablet, Rfl: 0   cholecalciferol, vitamin D3, (VITAMIN D3 ORAL), Take 2 tablets by mouth daily., Disp: , Rfl:    CRANBERRY ORAL, Take 2 capsules by mouth daily., Disp: , Rfl:    cyanocobalamin, vitamin B-12, (VITAMIN B-12 ORAL), Take 1 tablet by mouth daily., Disp: , Rfl:    eucalyptus oil/menthol/camphor (VICKS VAPORUB TOP), Apply 1 Application topically daily as needed., Disp: , Rfl:    ferrous sulfate 325 (65 FE) MG EC tablet, Take 1 tablet (325 mg total) by mouth in the morning., Disp: , Rfl:    lidocaine (ASPERCREME) 4 % patch, Place 1 patch on the skin daily to affected area on hip. Place patch for 12 hours only each day., Disp: 1 patch, Rfl: 0   lidocaine (LIDODERM) 5 % patch, Place 1 patch on the skin every twelve (12) hours. Apply to affected area for 12 hours only each day (then remove patch), Disp: 10 patch, Rfl: 0   meloxicam (MOBIC) 7.5 MG tablet, Take 1 tablet (7.5 mg total) by mouth two (2) times a day., Disp: , Rfl:    mirtazapine (REMERON) 7.5 MG tablet, Take 1 tablet (7.5 mg total) by mouth nightly., Disp: , Rfl:    NON FORMULARY, Take 30 mL by mouth daily. Cactus Juice for joints, Disp: , Rfl:    nystatin-triamcinolone (MYCOLOG II) 100,000-0.1 unit/g-% cream, Apply 1 Application topically two (2) times a day., Disp: , Rfl:    oxyCODONE (ROXICODONE) 5 MG immediate release tablet, Take 1 tablet (5 mg total) by mouth every eight (8) hours as needed for pain for up to 10 doses., Disp: 10 tablet, Rfl: 0   phenazopyridine (PYRIDIUM) 200 MG tablet, Take 1 tablet (200 mg total) by mouth Three (3) times a day as needed for pain for up to 2 days. Burning with urination, Disp: 6 tablet, Rfl: 0   polyethylene glycol (MIRALAX) 17 gram packet, Dissolve 1 packet (17 grams) in 4 to 8oz of fluid and drink by mouth two (2) times a day., Disp: 60 packet, Rfl: 2   SENNA 8.6 mg tablet, Take 2 tablets by mouth nightly.,  Disp: 60 tablet, Rfl: 2   solifenacin (VESICARE) 5 MG tablet, Take 1 tablet (5 mg total) by mouth daily., Disp: 90 tablet, Rfl: 3   tamsulosin (FLOMAX) 0.4 mg capsule, Take 1 capsule (0.4 mg total) by mouth daily for 10 days., Disp: 10 capsule, Rfl: 0  Allergies Patient has no known allergies.  Family History Family History[3]  Social History Short Social History[4]   Physical Exam   VITAL SIGNS:    Vitals:   03/16/24 0825 03/16/24 0836 03/16/24 1300  BP:  142/81 132/72  Pulse: 72 82 67  Resp:  18 16  Temp:  36.8 C (98.2 F) 36.8 C (98.2 F)  TempSrc:  Oral Oral  SpO2: 98% 98% 100%  Weight:  62.7 kg (138 lb 3.2 oz)     Constitutional: Alert and oriented. No acute distress. Eyes: Conjunctivae are normal. HEENT: Normocephalic and atraumatic. Conjunctivae clear. No congestion. Moist mucous membranes.  Cardiovascular: Rate as above, regular rhythm. Normal and symmetric distal pulses. Brisk capillary refill. Normal skin turgor. Respiratory: Normal respiratory effort. Breath sounds are normal. There are no wheezing or crackles heard. Gastrointestinal: Soft, non-distended, non-tender. Musculoskeletal: Non-tender with normal range of motion in all extremities. Neurologic: Normal speech and language. No gross focal neurologic deficits are appreciated. Patient is moving all  extremities equally, face is symmetric at rest and with speech. Skin: Skin is warm, dry and intact. No rash noted. Psychiatric: Mood and affect are normal. Speech and behavior are normal.   Radiology   XR Abdomen 1 View  Final Result  Nonobstructive bowel gas pattern. Moderate colonic stool burden. Rectal gas is seen.        Pertinent labs & imaging results that were available during my care of the patient were independently interpreted by me and considered in my medical decision making (see chart for details).  Portions of this record have been created using Scientist, clinical (histocompatibility and immunogenetics). Dictation errors  have been sought, but may not have been identified and corrected.  Documentation assistance was provided by Therisa Mulders, Scribe on March 16, 2024 at 9:03 AM for [place], DO.  Documentation assistance provided by the scribe. I was present during the time the encounter was recorded. The information recorded by the scribe was done at my direction and has been reviewed and validated by me.       [1] No past medical history on file. [2] Past Surgical History: Procedure Laterality Date   CHG X-RAY RETROGRADE PYELOGRAM Left 03/15/2024   Procedure: CHG X-RAY RETROGRADE PYELOGRAM;  Surgeon: Viprakasit, Nicholaus Mt, MD;  Location: CYSTO PROCEDURE SUITES [facility];  Service: Urology   PR CYSTO, LITHO, VACUUM KIDNEY Left 03/15/2024   Procedure: CYSTOURETHROSCOPY W URETEROSCOPY AND/OR PYELOSCOPY W LITHOTRIPSY AND URETERAL CATHETERIZATION FOR STEERABLE VACUUM ASPIRATION OF THE KIDNEY COLLECTING SYSTEM URETER BLADDER AND URETHRA IF APPLICABLE;  Surgeon: Kathlen Nicholaus Mt, MD;  Location: [facility] PROCEDURE SUITES [facility];  Service: Urology   PR CYSTO/URETERO W/LITHOTRIPSY &INDWELL STENT INSRT Left 03/15/2024   Procedure: CYSTOURETHROSCOPY, WITH URETEROSCOPY AND/OR PYELOSCOPY; WITH LITHOTRIPSY INCLUDING INSERTION OF INDWELLING URETERAL STENT;  Surgeon: Viprakasit, Nicholaus Mt, MD;  Location: [facility] PROCEDURE SUITES [facility];  Service: Urology   PR CYSTOSCOPY,REMV CALCULUS,SIMPLE Left 03/15/2024   Procedure: CYSTOURETHROSCOPY, WITH REMOVAL OF FOREIGN BODY, CALCULUS OR URETERAL STENT FROM URETHRA OR BLADDER; SIMPLE;  Surgeon: Viprakasit, Nicholaus Mt, MD;  Location: CYSTO PROCEDURE SUITES [facility];  Service: Urology   PR CYSTOURETHROSCOPY,URETER CATHETER Left 03/15/2024   Procedure: CYSTOURETHROSCOPY, W/URETERAL CATHETERIZATION, W/WO IRRIG, INSTILL, OR URETEROPYELOG, EXCLUS OF RADIOLG SVC;  Surgeon: Viprakasit, Nicholaus Mt, MD;  Location: CYSTO PROCEDURE SUITES [facility];  Service: Urology   PR INCISE BLADDER,+URETER CATH  Left 02/27/2024   Procedure: CYSTOTOMY WITH INSERTION OF URETERAL CATHETER OR STENT (SEPARATE PROCEDURE);  Surgeon: Claudene Jon Edelman, MD;  Location: OR [facility];  Service: Urology  [3] No family history on file. [4] Social History Tobacco Use   Smoking status: Never   Smokeless tobacco: Never  Substance Use Topics   Alcohol  use: Never   Teresa Rochele BRAVO, MD Resident 03/17/24 [phone]

## 2024-03-19 NOTE — ED Provider Notes (Signed)
 Laura Stevens   ED Clinical Impression   Final diagnoses:  Urinary tract infection with hematuria, site unspecified (Primary)    Medical Decision Making, ED Course   BP 146/84   Pulse 71   Temp 36.6 C (97.9 F) (Oral)   Resp 16   SpO2 100%   Initial Clinical Impression/DDX/Medical Decision Making 68-yo very well-appearing female presents with abdominal and flank discomfort similar to when she initially needed a ureteral stent. Afebrile and otherwise reassuring vitals. On chart review her urinalysis looked infected at last presentation and it does not appear she was treated, so I suspect her symptoms are due to pyelonephritis. Will scan her for persistent stone/infected stone that may require intervention and involve urology as needed. She does not appear septic.  ED Course: ED Course as of 03/21/24 0021  Sun Mar 19, 2024  1347 CT Abdomen Pelvis Wo Contrast Interval removal of the left ureteral stent with persistent mild hydronephrosis. There is no evidence of a collecting system calculi.   Otherwise, unremarkable abdomen/pelvis.   1347 Leukocyte Esterase, UA(!): Large  1347 Blood, UA(!): Moderate  1347 RBC, UA(!): 46  1347 WBC, UA(!): >182  1347 Bacteria, UA(!): Few  1348 No obstructing stone seen on CTAP but urine is consistent with infection. Chart review finds patient has follow-up appointment with urology in March. Laura Stevens, Laura Stevens MRN [id number] in the ED is back in the ED - no stone was seen on her CTAP but she does have persistent hydro and a UTI, would you recommend intervention or earlier follow-up?    Paged urology   [phone] Speaking with Laura Stevens from urology. Recommends not intervening based on imaging. Agrees no stone on imaging. No AKI. Does recommend going home on antibiotics based on prior cultures. Recommends 14-day course to treat like pyelo. No indication to stent given no infected obstruction or AKI. Flomax as well if not  already.  1358 On my chart review cultures grew pansensitive E. coli      HPI, Physical Exam   HPI: March 19, 2024 11:54 AM  History of Present Illness Laura Stevens is a 68 year old female with recurrent cystitis and nephrolithiasis who presents with abdominal and chest pain.  She was seen in the emergency department three days ago due to increased ureteral stent string. An abdominal x-ray was performed at that time, and her urinalysis showed infection. The stent fell out while she was in the ED, and she was discharged without it.  She describes her abdominal pain as persistent, with a pain level of nine out of ten. She experiences sweating episodes, which started the previous evening, and reports a burning sensation during urination. She has been using hemorrhoid cream but did not apply it yesterday. She has taken two Tylenol and oxycodone for pain, but the oxycodone did not alleviate her symptoms.  She has a history of kidney stones. No back pain, but she notes discomfort in her sides and stomach.  In terms of medication, she has been using Tylenol and oxycodone for pain management. She also uses patches on her knees and hip for pain relief.  Physical Exam:  Constitutional: Alert and oriented. No acute distress. HEENT: Normocephalic and atraumatic. Conjunctivae clear. No congestion. Moist mucous membranes.  CV: Rate as above, rhythm regular.  Extremities warm. Pulm: Normal work of breathing, no respiratory distress. GI: Non-distended, soft, tender to palpation in lower quadrants without guarding MSK: Moving extremities equally Neuro: Normal speech and language. Patient is moving  all extremities equally, face is symmetric at rest and with speech. Skin: Skin is warm, dry and intact. No rash noted.  Vitals:   03/19/24 1218 03/19/24 1230 03/19/24 1605  BP: 136/82  146/84  Pulse: 72 69 71  Resp: 9 21 16   Temp:  36.9 C (98.4 F) 36.6 C (97.9 F)  TempSrc:  Oral Oral  SpO2:  99% 100% 100%     Discussion of Management with other Physicians, QHP or Appropriate Source: If applicable, as documented in ED course above Independent Interpretation of Studies: If applicable, documented in ED course above. I have reviewed recent and relevant previous record, including: If applicable, inpatient/outpatient notes and prior studies, documented in Impression/MDM     ____________________________________________  The case was discussed with the attending physician, who is in agreement with the above assessment and plan.   Additional History Elements   Chief Complaint Chief Complaint  Patient presents with   Abdominal Pain   Chest Pain     Past Medical History[1]  Past Surgical History[2]  Allergies Patient has no known allergies.  Family History Family History[3]  Social History Short Social History[4]   Radiology   CT Abdomen Pelvis Wo Contrast  Final Result  1.  Interval removal of the left ureteral stent with minimal hydronephrosis. There is no evidence of collecting system calculi.  2.  Mildly enlarged retroperitoneal lymph nodes, likely reactive.      Pertinent labs & imaging results that were available during my care of the patient were independently interpreted by me and considered in my medical decision making (see chart for details).  Portions of this record have been created using Scientist, clinical (histocompatibility and immunogenetics). Dictation errors have been sought, but may not have been identified and corrected.       [1] No past medical history on file. [2] Past Surgical History: Procedure Laterality Date   CHG X-RAY RETROGRADE PYELOGRAM Left 03/15/2024   Procedure: CHG X-RAY RETROGRADE PYELOGRAM;  Surgeon: Viprakasit, Nicholaus Mt, MD;  Location: [facility] PROCEDURE SUITES [facility];  Service: Urology   PR CYSTO, LITHO, VACUUM KIDNEY Left 03/15/2024   Procedure: CYSTOURETHROSCOPY W URETEROSCOPY AND/OR PYELOSCOPY W LITHOTRIPSY AND URETERAL CATHETERIZATION FOR  STEERABLE VACUUM ASPIRATION OF THE KIDNEY COLLECTING SYSTEM URETER BLADDER AND URETHRA IF APPLICABLE;  Surgeon: Kathlen Nicholaus Mt, MD;  Location: [facility] PROCEDURE SUITES [facility];  Service: Urology   PR CYSTO/URETERO W/LITHOTRIPSY &INDWELL STENT INSRT Left 03/15/2024   Procedure: CYSTOURETHROSCOPY, WITH URETEROSCOPY AND/OR PYELOSCOPY; WITH LITHOTRIPSY INCLUDING INSERTION OF INDWELLING URETERAL STENT;  Surgeon: Viprakasit, Nicholaus Mt, MD;  Location: [facility] PROCEDURE SUITES [facility];  Service: Urology   PR CYSTOSCOPY,REMV CALCULUS,SIMPLE Left 03/15/2024   Procedure: CYSTOURETHROSCOPY, WITH REMOVAL OF FOREIGN BODY, CALCULUS OR URETERAL STENT FROM URETHRA OR BLADDER; SIMPLE;  Surgeon: Viprakasit, Nicholaus Mt, MD;  Location: [facility] PROCEDURE SUITES [facility];  Service: Urology   PR CYSTOURETHROSCOPY,URETER CATHETER Left 03/15/2024   Procedure: CYSTOURETHROSCOPY, W/URETERAL CATHETERIZATION, W/WO IRRIG, INSTILL, OR URETEROPYELOG, EXCLUS OF RADIOLG SVC;  Surgeon: Viprakasit, Nicholaus Mt, MD;  Location: [facility] PROCEDURE SUITES [facility];  Service: Urology   PR INCISE BLADDER,+URETER CATH Left 02/27/2024   Procedure: CYSTOTOMY WITH INSERTION OF URETERAL CATHETER OR STENT (SEPARATE PROCEDURE);  Surgeon: Claudene Jon Edelman, MD;  Location: OR [facility];  Service: Urology  [3] History reviewed. No pertinent family history. [4] Social History Tobacco Use   Smoking status: Never   Smokeless tobacco: Never  Substance Use Topics   Alcohol  use: Never   Laura Monico CROME, MD Resident 03/21/24 [phone]

## 2024-03-23 NOTE — Progress Notes (Signed)
 " Subjective:  CC: Chief Complaint  Patient presents with   Referral Request    Urology       Patient ID: Laura Stevens is a 68 y.o. female. This an established patient is here today for an Acute Problem Office Visit.  HPI: Pt is here today just to discuss referral to Urology. Pt recently had several visits to the ED related to UTI, hematuria, Nephrolithiasis, having had a Cystourethroscopy with stent placement. Has had multiple visits on separate occasions and several visits related to the same occurrence. She is wanting to find out the source of the stones in order to prevent them. Besides in the ED, pt has never seen Urology in the past.    Review of Systems: Review of Systems  Constitutional:  Negative for chills and fever.  HENT:  Negative for ear pain and sore throat.   Eyes:  Negative for pain and visual disturbance.  Respiratory:  Negative for cough and shortness of breath.   Cardiovascular:  Negative for chest pain and palpitations.  Gastrointestinal:  Negative for abdominal pain and vomiting.  Genitourinary:  Negative for dysuria and hematuria.  Musculoskeletal:  Negative for arthralgias and back pain.  Skin:  Negative for color change and rash.  Neurological:  Negative for seizures and syncope.  All other systems reviewed and are negative.   Allergies: Patient has no known allergies.  Past Medical/Surgical History: Past Medical History:  Diagnosis Date   Primary osteoarthritis of both knees 05/07/2020   History reviewed. No pertinent surgical history.  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   Social Drivers of Corporate Investment Banker Strain: Low Risk (12/26/2023)   Received from [facility]   Overall Financial Resource Strain (CARDIA)    How hard is it for you to pay for the very basics like food, housing, medical care, and heating?: Not very hard  Food Insecurity: No Food Insecurity (12/26/2023)   Received from [facility]   Hunger Vital Sign    Within the past 12 months, you worried that your food would run out before you got the money to buy more.: Never true    Within the past 12 months, the food you bought just didn't last and you didn't have money to get more.: Never true  Transportation Needs: No Transportation Needs (12/26/2023)   Received from [facility]    Lack of Transportation (Medical): No    Lack of Transportation (Non-Medical): No  Housing Stability: Low Risk (12/26/2023)   Received from [facility]   Housing    Within the past 12 months, have you ever stayed: outside, in a car, in a tent, in an overnight shelter, or temporarily in someone else's home(i.e.couch-surfing)?: No    Are you worried about losing your housing?: No    Family History: Family History  Problem Relation Name Age of Onset   Myocardial Infarction (Heart attack) Mother     Lung disease Sister     Diabetes type II Brother  No Known Problems Maternal Grandmother     No Known Problems Maternal Grandfather     No Known Problems Paternal Grandmother     No Known Problems Paternal Grandfather     Cancer Neg Hx     Colon cancer Neg Hx     Breast cancer Neg Hx      Immunizations: Immunization History  Administered Date(s) Administered   COVID-19 Moderna Vaccine (1st,2nd,3rd dose = 0.67ml) 06/03/2019, 07/01/2019, 02/21/2020, 09/27/2020   RZV(>=39YR -OR-19+YRS IF  IMMCOMP) VACCINE STARLA) 09/05/2019, 12/06/2019   TD (>=70YR) VACCINE (TDVAX) 12/26/1991   TDAP (>=70YR) VACCINE (ADACEL/BOOSTRIX) 05/02/2021     Depression Screening: Depression assessment: Patient underlying factors that can contribute to depression: none  PHQ 2/9 last 3 flowsheet values    10/01/2023   10:13 AM 02/04/2024    1:57 PM 03/23/2024    2:53 PM  PHQ-2/9 Depression Screening   Little interest or pleasure in doing things 0 0 0  Feeling down, depressed, or hopeless 0 0 0  Patient Health Questionnaire-2 Score 0 0 0  Trouble falling or staying asleep, or sleeping too much 1    Feeling tired or having little energy 0    Poor appetite or overeating 1    Feeling bad about yourself - or that you are a failure or have let yourself or your family down 0    Trouble concentrating on things, such as reading the newspaper or watching television 0    Moving or speaking so slowly that other people could have noticed? Or the opposite - being so fidgety or restless that you have been moving around a lot more than usual. 0    Thoughts that you would be better off dead or hurting yourself in some way 0    Patient Health Questionnaire-9 Score 2        Is the depression screen above positive? (Score >9) No, the score was 0-4  Time Spent: 0-4 min      Goals:  Goals      Maintain health/healthy lifestyle        Current Medications: Outpatient Medications Marked as Taking for the 03/23/24 encounter (Office Visit) with Steva Clotilda Conn, NP  Medication Sig Dispense Refill   acetaminophen (TYLENOL) 500 MG tablet Take 500 mg by mouth as needed for Pain     ascorbic acid, vitamin C, (VITAMIN C) 500 MG tablet Take 1 tablet (500 mg total) by mouth 2 (two) times daily Take at the same time as the Ferrous Sulfate to aid in absorption. 180 tablet 3   cephalexin  (KEFLEX ) 500 MG capsule Take 500 mg by mouth 3 (three) times daily     cholecalciferol (VITAMIN D3) 2,000 unit capsule Take 1 capsule (2,000 Units total) by mouth once daily     cranberry 500 mg Cap Take by mouth     ferrous sulfate 325 (65 FE) MG tablet Take  1 tablet (325 mg total) by mouth 2 (two) times daily with meals Take at the same time at the Ascorbic Acid (Vitamin C) to aid in absorption 180 tablet 3   lidocaine (LIDODERM) 5 % patch Place 2 patches onto the skin daily Apply patch to the most painful area for up to 12 hours in a 24 hour period. 60 patch 11   mirtazapine (REMERON) 7.5 MG tablet Take 1 tablet (7.5 mg total) by mouth at bedtime 30 tablet 11   TURMERIC ORAL Take 1 capsule by mouth once daily     [DISCONTINUED]  ketorolac (TORADOL) 10 mg tablet Take 10 mg by mouth every 6 (six) hours as needed     [DISCONTINUED] meloxicam (MOBIC) 7.5 MG tablet Take 1 tablet (7.5 mg total) by mouth 2 (two) times daily 180 tablet 3   [DISCONTINUED] nystatin-triamcinolone cream Apply topically 4 (four) times daily 30 g 1   [DISCONTINUED] oxyCODONE (ROXICODONE) 5 MG immediate release tablet Take 1 tablet (5 mg total) by mouth every 4 (four) hours as needed for Pain 60 tablet 0   [DISCONTINUED] tamsulosin (FLOMAX) 0.4 mg capsule Take 0.4 mg by mouth once daily     [DISCONTINUED] tiZANidine (ZANAFLEX) 4 MG tablet Take 4 mg by mouth     [DISCONTINUED] zinc oxide (DESITIN DAILY DEFENSE) 13 % Crea Apply 1 Application topically continuously as needed (for skin irritation) - use after the skin is healed for prevention of irritation, itching and maceration 57 g 6    Medication list above reconciled and reviewed for current and on-going appropriateness using patient's verbal report of what she is taking.      Objective:   Vitals:   03/23/24 1452  BP: (!) 140/70  Pulse: 68   Ht:170.2 cm (5' 7) Wt:60.8 kg (134 lb) AFP:Anib mass index is 20.99 kg/m.   Physical Exam Vitals and nursing note reviewed.  Constitutional:      General: She is not in acute distress.    Appearance: Normal appearance. She is well-developed. She is not diaphoretic.  HENT:     Head: Normocephalic.  Eyes:     Pupils: Pupils are equal, round, and reactive to light.   Neck:     Vascular: No JVD.  Musculoskeletal:     Cervical back: Normal range of motion.  Skin:    General: Skin is warm and dry.  Neurological:     Mental Status: She is alert and oriented to person, place, and time.  Psychiatric:        Behavior: Behavior normal. Behavior is cooperative.        Thought Content: Thought content normal.        Judgment: Judgment normal.         Office Visit on 02/04/2024  Component Date Value Ref Range Status   Glucose 02/04/2024 106  70 - 110 mg/dL Final   Sodium 89/68/7974 135 (L)  136 - 145 mmol/L Final   Potassium 02/04/2024 5.2 (H)  3.6 - 5.1 mmol/L Final   Chloride 02/04/2024 103  97 - 109 mmol/L Final   Carbon Dioxide (CO2) 02/04/2024 25.8  22.0 - 32.0 mmol/L Final   Urea Nitrogen (BUN) 02/04/2024 21  7 - 25 mg/dL Final   Creatinine 89/68/7974 1.3 (H)  0.6 - 1.1 mg/dL Final   Glomerular Filtration Rate (eGFR) 02/04/2024 45 (L)  >60 mL/min/1.73sq m Final   Calcium 02/04/2024 9.4  8.7 - 10.3 mg/dL Final   AST  89/68/7974 22  8 - 39 U/L Final   ALT  02/04/2024 17  5 - 38 U/L Final   Alk Phos (alkaline Phosphatase) 02/04/2024 84  34 - 104 U/L Final   Albumin 02/04/2024 3.1 (L)  3.5 - 4.8 g/dL Final   Bilirubin, Total 02/04/2024 0.3  0.3 - 1.2 mg/dL Final   Protein, Total 02/04/2024 7.6  6.1 - 7.9 g/dL Final   A/G Ratio 89/68/7974 0.7 (L)  1.0 - 5.0 gm/dL Final   White Blood Cell Count - [facility] 02/04/2024 22.0 (>)  3.4 - 10.8 x10E3/uL Final   Red Blood Cell Count - [facility]  02/04/2024 2.81 (L)  3.77 - 5.28 x10E6/uL Final   Hemoglobin - [facility] 02/04/2024 8.3 (L)  11.1 - 15.9 g/dL Final   Hematocrit - [facility] 02/04/2024 26.4 (L)  34.0 - 46.6 % Final   MCV - [facility] 02/04/2024 94  79 - 97 fL Final   MCH  - [facility] 02/04/2024 29.5  26.6 - 33.0 pg Final   MCHC - [facility] 02/04/2024 31.4 (L)  31.5 - 35.7 g/dL Final   RDW - [facility] 89/68/7974 12.7  11.7 - 15.4 % Final   Platelet Count - [facility] 02/04/2024 604 (H)  150  - 450 x10E3/uL Final   Neutrophils - [facility] 02/04/2024 73  Not Estab. % Final   LYMPHS -  [facility] 02/04/2024 15  Not Estab. % Final   Monocytes - [facility] 02/04/2024 8  Not Estab. % Final   Eos - [facility] 02/04/2024 1  Not Estab. % Final   Basos - [facility] 02/04/2024 0  Not Estab. % Final   Neutrophils (Absolute) - [facility] 02/04/2024 16.3 (H)  1.4 - 7.0 x10E3/uL Final   Lymphs (Absolute) - [facility] 02/04/2024 3.3 (H)  0.7 - 3.1 x10E3/uL Final   Monocytes(Absolute) - [facility] 02/04/2024 1.7 (H)  0.1 - 0.9 x10E3/uL Final   Eos (Absolute) - [facility] 02/04/2024 0.1  0.0 - 0.4 x10E3/uL Final   Baso (Absolute) - [facility] 02/04/2024 0.1  0.0 - 0.2 x10E3/uL Final   Immature Granulocytes - [facility] 02/04/2024 3  Not Estab. % Final   Immature Grans (Abs) - [facility] 02/04/2024 0.6 (H)  0.0 - 0.1 x10E3/uL Final     Assessment and Plan:   Diagnoses and all orders for this visit:  Calculus of kidney  Depression screening -     Depression Screen -(PHQ- 2/9, BDI)  Recurrent UTI (urinary tract infection) -     Ambulatory Referral to Urology    No follow-ups on file., sooner as needed.    Current Medical Providers and Suppliers:   [facility] Patient Care Team: Steva Clotilda Conn, NP as PCP - General (Family Medicine) Future Appointments     Date/Time Provider Department Center Visit Type   10/13/2024 10:45 AM Steva Clotilda Conn, NP [facility]        Attestation Statement:   I personally performed the service, non-incident to. (WP)   SHANNON HIATT COWARD, NP  An after visit summary with all of these plans was provided for the patient either in written format or through MyChart.       Clotilda H. Coward, AGNP-C    "

## 2024-03-27 NOTE — Progress Notes (Signed)
 " [facility] Encounter Provider: Rockey GORMAN Fang, PA Date of Service: 03/27/2024  CC: No chief complaint on file.   0-10 Pain Scale: 7 Pain Location: Hip Pain Orientation: Right Clinical Progression: Gradually improving     History of Present Illness Laura Stevens is a 68 year old female who presents with right hip pain and difficulty ambulating.  She has been experiencing right hip pain for approximately two weeks, which began after she was unable to get out of the bathtub without assistance. An emergency room visit revealed arthritis on an x-ray, and an MRI was performed to rule out a fracture. She has no prior history of right hip problems.  The pain is located in the right hip and radiates down the leg, reaching the knee and sometimes the foot. She describes intermittent numbness and tingling. She is currently using a walker for mobility and experiences significant pain when standing or walking.  She has been prescribed pain medication in the past, but she cannot recall the specific medication. She is currently receiving pain management at a rehabilitation facility, but she is unsure of the medication being administered. She is also undergoing physical therapy in [place] but reports limited ability to complete exercises due to pain.  She has not had any injections for her hip pain. She has a history of anemia and abnormal lab results, including low albumin, high white blood cell count, low red blood cell count, and high platelets, which were noted during her recent hospital visit. She has not seen her primary care physician since her emergency room visit.  02/09/24 She returns with hip pain for follow-up after hospitalization. She is accompanied by her nephew.  She experiences ongoing hip pain, primarily in the right hip, with occasional radiation down the entire leg. The pain is persistent. She finds some relief by using her arm  more.  She has numbness and tingling sensations in her leg, persisting since her last visit. She has been told she has spinal canal narrowing at L4 to L5.  She has not yet received the scheduled injection intended to help diagnose the source of her pain, whether it is primarily from her hip or lower back. Her right hip shows significant arthritis on x-ray, and she has had an MRI of her hip.  For pain management, she is currently taking oxycodone, which she obtained last week. She is interested in using a pain patch for additional relief.  Recent lab work indicates persistent worrisome abnormalities. She is unsure if her primary care doctor is addressing these issues, as she has not received any follow-up communication regarding the lab results.  She has healthcare providers visiting her home to assist with exercises and mobility.  03/27/24  She presents for follow-up of persistent right hip and lower extremity pain.  She continues to experience right hip pain primarily localized to the lateral aspect, with radiation to the knee but not extending below. The pain has persisted despite a recent injection, which provided only minimal improvement and no immediate relief. Discomfort remains above and beyond the area of the injection.  She also reports ongoing lower back pain.  Of note, she relates that as of the first of next year, her insurance will no longer be accepted here at [facility].    ROS: Negative for fever, chills, chest pain, cough, SOB.  PMH/PSH:  has no past medical history on file.   GLYCATED HEMOGLOBIN: No results found for: A1C  Social Hx: Tobacco Use: Low Risk (  03/27/2024)   Patient History    Smoking Tobacco Use: Never    Smokeless Tobacco Use: Never    Passive Exposure: Not on file     BMI: Estimated body mass index is 22.31 kg/m as calculated from the following:   Height as of 03/15/24: 167.6 cm (5' 6).   Weight as of 03/16/24: 62.7 kg (138 lb 3.2 oz). No  height and weight on file for this encounter.    DETAILED PHYSICAL EXAM (12 Point) General Appearance Well-nourished, in no acute distress.  Mood and Affect Alert, cooperative and pleasant.  Pulmonary No labored breathing or shortness of breath  Cardiovascular Well-perfused distally and no edema.  Lymphatics No lymphadenopathy  Sensation Sensation to light touch distally normal   MUSCULOSKELETAL   Right hip Inspection/palpation Range of motion Stability Strength Skin Musculoskeletal: LE: No open wounds or erythema. Positive log roll and Stinchfield exams. Trochanteric bursa nontender. Painful flexion, internal and external rotation upon passive ranging of the hip with limited ROM. 4/5 strength with hip abduction, and flexion.     Imaging: Radiographs of right hip obtained by another provider on 12/26/23 were independently interpreted, and reviewed with the patient. These demonstrate end-stage osteoarthritic changes including complete loss of joint space, osteophyte formation, subchondral sclerosis. No evidence of fracture or dislocation as confirmed by MRI of the right hip performed on 12/27/2023.  Assessment & Plan Right hip osteoarthritis Chronic right hip osteoarthritis with persistent lateral hip pain and minimal improvement post-injection. Symptoms localized to hip, radiating to knee. Further orthopaedic evaluation needed. - Discussed partial pain relief post-injection. - Referred to [facility] in [place] for ongoing management due to insurance issues  Lumbar spinal stenosis with radiculopathy Lumbar spinal stenosis with radiculopathy contributes to overall pain, overlapping with hip pathology and knee radiation. Further evaluation needed to clarify lumbar contribution. - Included in referral to [facility] for further evaluation and management.   Patient demonstrated understanding and was in agreement with the plan outlined above. All questions were  answered.   E&M Coding:  MEDICAL DECISION MAKING (level of service defined by 2/3 elements)   Number/Complexity of Problems Addressed 1 or more chronic illnesses with exacerbation, progression, or side effects of treatment (99204/99214)  Amount/Complexity of Data to be Reviewed/Analyzed 2 points: Review prior notes (1 point per unique source); Review test results (1 point per unique test); Order tests (1 point per unique test) (99203/99213)  Risk of Complications/Morbidity/Mortality of Management --LOW Risk of Morbidity from Additional Diagnostic Testing or Treatment (99203/99213)--   Or TIME   Total Time for E/M Services on the Date of Encounter N/A    "

## 2024-04-09 ENCOUNTER — Emergency Department

## 2024-04-09 ENCOUNTER — Emergency Department
Admission: EM | Admit: 2024-04-09 | Discharge: 2024-04-09 | Disposition: A | Discharge status: Home / Self Care | Attending: Emergency Medicine | Admitting: Emergency Medicine

## 2024-04-09 ENCOUNTER — Other Ambulatory Visit: Payer: Self-pay

## 2024-04-09 DIAGNOSIS — R1030 Lower abdominal pain, unspecified: Secondary | ICD-10-CM

## 2024-04-09 DIAGNOSIS — N12 Tubulo-interstitial nephritis, not specified as acute or chronic: Secondary | ICD-10-CM | POA: Insufficient documentation

## 2024-04-09 DIAGNOSIS — R1031 Right lower quadrant pain: Secondary | ICD-10-CM | POA: Diagnosis present

## 2024-04-09 LAB — CBC
HCT: 38.8 % (ref 36.0–46.0)
Hemoglobin: 12.5 g/dL (ref 12.0–15.0)
MCH: 30.3 pg (ref 26.0–34.0)
MCHC: 32.2 g/dL (ref 30.0–36.0)
MCV: 93.9 fL (ref 80.0–100.0)
Platelets: 276 K/uL (ref 150–400)
RBC: 4.13 MIL/uL (ref 3.87–5.11)
RDW: 14.8 % (ref 11.5–15.5)
WBC: 5 K/uL (ref 4.0–10.5)
nRBC: 0 % (ref 0.0–0.2)

## 2024-04-09 LAB — COMPREHENSIVE METABOLIC PANEL WITH GFR
ALT: 14 U/L (ref 0–44)
AST: 22 U/L (ref 15–41)
Albumin: 4.3 g/dL (ref 3.5–5.0)
Alkaline Phosphatase: 61 U/L (ref 38–126)
Anion gap: 12 (ref 5–15)
BUN: 16 mg/dL (ref 8–23)
CO2: 24 mmol/L (ref 22–32)
Calcium: 10.2 mg/dL (ref 8.9–10.3)
Chloride: 104 mmol/L (ref 98–111)
Creatinine, Ser: 0.91 mg/dL (ref 0.44–1.00)
GFR, Estimated: >60 mL/min
Glucose, Bld: 105 mg/dL — ABNORMAL HIGH (ref 70–99)
Potassium: 4.6 mmol/L (ref 3.5–5.1)
Sodium: 140 mmol/L (ref 135–145)
Total Bilirubin: 0.4 mg/dL (ref 0.0–1.2)
Total Protein: 8.2 g/dL — ABNORMAL HIGH (ref 6.5–8.1)

## 2024-04-09 LAB — URINALYSIS, ROUTINE W REFLEX MICROSCOPIC
Bacteria, UA: NONE SEEN
Bilirubin Urine: NEGATIVE
Glucose, UA: NEGATIVE mg/dL
Hgb urine dipstick: NEGATIVE
Ketones, ur: NEGATIVE mg/dL
Nitrite: NEGATIVE
Protein, ur: NEGATIVE mg/dL
Specific Gravity, Urine: 1.006 (ref 1.005–1.030)
WBC, UA: >50 WBC/hpf (ref 0–5)
pH: 7 (ref 5.0–8.0)

## 2024-04-09 LAB — LIPASE, BLOOD: Lipase: 45 U/L (ref 11–51)

## 2024-04-09 MED ORDER — IOHEXOL 300 MG/ML  SOLN
100.0000 mL | Freq: Once | INTRAMUSCULAR | Status: AC | PRN
  Administered 2024-04-09: 100 mL via INTRAVENOUS

## 2024-04-09 MED ORDER — TRAMADOL HCL 50 MG PO TABS
50.0000 mg | ORAL_TABLET | Freq: Once | ORAL | Status: AC
  Administered 2024-04-09: 50 mg via ORAL
  Filled 2024-04-09: qty 1

## 2024-04-09 MED ORDER — SODIUM CHLORIDE 0.9 % IV SOLN
1.0000 g | Freq: Once | INTRAVENOUS | Status: AC
  Administered 2024-04-09: 1 g via INTRAVENOUS
  Filled 2024-04-09: qty 10

## 2024-04-09 MED ORDER — CEFDINIR 300 MG PO CAPS: 300.0000 mg | ORAL_CAPSULE | Freq: Two times a day (BID) | ORAL | 0 refills | Status: AC

## 2024-04-09 MED ORDER — TRAMADOL HCL 50 MG PO TABS: 50.0000 mg | ORAL_TABLET | Freq: Four times a day (QID) | ORAL | 0 refills | Status: AC | PRN

## 2024-04-09 NOTE — ED Notes (Signed)
 See triage note  Presents with abd pain which moves into lower back States this started yesterday  Denies any fever or n/v

## 2024-04-09 NOTE — Discharge Instructions (Signed)
 Take either your remaining oxycodone or the tramadol  prescribed today for pain.  Do not mix these 2 medications.  Make sure to drink plenty of fluids.  Take the antibiotic as prescribed and finish the full 10-day course.  Follow-up with your regular doctor.  In the meantime, return to the ER for new, worsening, or persistent severe abdominal or flank pain, vomiting, fever, or any other new or worsening symptoms that concern you.

## 2024-04-09 NOTE — ED Provider Notes (Signed)
 "  [facility] Provider Note    Event Date/Time   First MD Initiated Contact with Patient 04/09/24 1335     (approximate)   History   Abdominal Pain   HPI  Laura Stevens is a 69 y.o. female with history of recurrent cystitis and osteoarthritis who presents with abdominal pain since yesterday evening, persistent course, bilateral, mainly in the lower abdomen across and below her bellybutton.  She denies associated nausea or vomiting.  She has no diarrhea.  She had a normal bowel movement this morning.  She reports some dysuria and oliguria.  She denies hematuria.  She has no fever or chills.  I reviewed the past medical records.  The patient's most recent outpatient encounter was on 12/22 with orthopedics for hip pain.  She was seen at the [facility] ED on 12/14 with abdominal and flank pain.  She was diagnosed with pyelonephritis.  CT showed no concerning acute findings.   Physical Exam   Triage Vital Signs: ED Triage Vitals [04/09/24 1235]  Encounter Vitals Group     BP (!) 160/83     Girls Systolic BP Percentile      Girls Diastolic BP Percentile      Boys Systolic BP Percentile      Boys Diastolic BP Percentile      Pulse Rate 70     Resp 18     Temp 97.8 F (36.6 C)     Temp Source Oral     SpO2 98 %     Weight 134 lb (60.8 kg)     Height 5' 6 (1.676 m)     Head Circumference      Peak Flow      Pain Score 8     Pain Loc      Pain Education      Exclude from Growth Chart     Most recent vital signs: Vitals:   04/09/24 1235 04/09/24 1700  BP: (!) 160/83 (!) 187/88  Pulse: 70 (!) 58  Resp: 18 16  Temp: 97.8 F (36.6 C) 98.2 F (36.8 C)  SpO2: 98% 99%     General: Alert, well-appearing, no distress.  CV:  Good peripheral perfusion.  Resp:  Normal effort.  Abd:  Soft with mild bilateral lower quadrant discomfort.  No peritoneal signs.  No distention.  Other:  No jaundice or scleral icterus.  Moist mucous membranes.   ED Results /  Procedures / Treatments   Labs (all labs ordered are listed, but only abnormal results are displayed) Labs Reviewed  COMPREHENSIVE METABOLIC PANEL WITH GFR - Abnormal; Notable for the following components:      Result Value   Glucose, Bld 105 (*)    Total Protein 8.2 (*)    All other components within normal limits  URINALYSIS, ROUTINE W REFLEX MICROSCOPIC - Abnormal; Notable for the following components:   Color, Urine STRAW (*)    APPearance CLEAR (*)    Leukocytes,Ua MODERATE (*)    All other components within normal limits  LIPASE, BLOOD  CBC     EKG     RADIOLOGY  CT abdomen/pelvis: I independently viewed and interpreted the images; there are no dilated bowel loops or any free air or free fluid.  Radiology report indicates the following:  IMPRESSION:  1. Pyelonephritis on the left. No renal calculus or obstructive  uropathy bilaterally.  2. Hepatic steatosis.  3. Small right inguinal hernia containing nonobstructed small bowel.  4. Fibroid uterus.  5. Aortic atherosclerosis.    PROCEDURES:  Critical Care performed: No  Procedures   MEDICATIONS ORDERED IN ED: Medications  traMADol  (ULTRAM ) tablet 50 mg (50 mg Oral Given 04/09/24 1443)  iohexol  (OMNIPAQUE ) 300 MG/ML solution 100 mL (100 mLs Intravenous Contrast Given 04/09/24 1532)  cefTRIAXone  (ROCEPHIN ) 1 g in sodium chloride  0.9 % 100 mL IVPB (0 g Intravenous Stopped 04/09/24 1728)     IMPRESSION / MDM / ASSESSMENT AND PLAN / ED COURSE  I reviewed the triage vital signs and the nursing notes.  69 year old female with PMH as noted above presents with bilateral lower abdominal pain since yesterday with no significant associated symptoms except for dysuria.  On exam she is overall well-appearing.  Vital signs are normal except for hypertension.  The abdomen is soft with mild discomfort but no focal tenderness or peritoneal signs.  Differential diagnosis includes, but is not limited to, diverticulitis, colitis,  gastroenteritis, cystitis, other UTI, musculoskeletal pain.  CMP and CBC show no acute findings.  There is no leukocytosis.  Lipase is normal.  Urinalysis shows some leukocyte esterase and WBCs but no bacteria.  We will obtain a CT for further evaluation.  Patient's presentation is most consistent with acute complicated illness / injury requiring diagnostic workup.  ----------------------------------------- 6:19 PM on 04/09/2024 -----------------------------------------  CT shows evidence of left-sided pyelonephritis, which could go along with the patient's urinalysis findings.  I gave a dose of IV ceftriaxone .  I did consider whether the patient may benefit from inpatient admission, however given her otherwise reassuring lab workup with no leukocytosis and a normal creatinine, as well as her stable vital signs and no evidence of sepsis, she is appropriate for outpatient treatment and stable for discharge.  She feels comfortable going home.  I counseled her on the results of the workup and plan of care.  I answered all of her questions.  I gave strict return precautions, and she expressed understanding.  FINAL CLINICAL IMPRESSION(S) / ED DIAGNOSES   Final diagnoses:  Lower abdominal pain  Pyelonephritis     Rx / DC Orders   ED Discharge Orders          Ordered    traMADol  (ULTRAM ) 50 MG tablet  Every 6 hours PRN        04/09/24 1740    cefdinir  (OMNICEF ) 300 MG capsule  2 times daily        04/09/24 1740             Note:  This document was prepared using Dragon voice recognition software and may include unintentional dictation errors.    Jacolyn Pae, MD 04/09/24 1820  "

## 2024-04-09 NOTE — ED Triage Notes (Signed)
 Pt presents to the ED via [facility] from home. EMS was called out for abdominal pain that started yesterday. Reports 8/10 generalized abd pain that radiates to her sides and back. Pt had a kidney stone in Oct. Pt reports less frequent urination, but denies other urinary sx's. EMS gave 1,000mg  tylenol pta.

## 2024-04-22 ENCOUNTER — Encounter: Payer: Self-pay | Admitting: Emergency Medicine

## 2024-04-22 ENCOUNTER — Ambulatory Visit
Admission: EM | Admit: 2024-04-22 | Discharge: 2024-04-22 | Disposition: A | Discharge status: Home / Self Care | Attending: Physician Assistant | Admitting: Physician Assistant

## 2024-04-22 DIAGNOSIS — N390 Urinary tract infection, site not specified: Secondary | ICD-10-CM | POA: Insufficient documentation

## 2024-04-22 DIAGNOSIS — A0472 Enterocolitis due to Clostridium difficile, not specified as recurrent: Secondary | ICD-10-CM | POA: Insufficient documentation

## 2024-04-22 DIAGNOSIS — R197 Diarrhea, unspecified: Secondary | ICD-10-CM | POA: Diagnosis present

## 2024-04-22 DIAGNOSIS — R1030 Lower abdominal pain, unspecified: Secondary | ICD-10-CM | POA: Diagnosis present

## 2024-04-22 HISTORY — DX: Disorder of kidney and ureter, unspecified: N28.9

## 2024-04-22 LAB — C DIFFICILE QUICK SCREEN W PCR REFLEX
C Diff antigen: POSITIVE — AB
C Diff interpretation: DETECTED
C Diff toxin: POSITIVE — AB

## 2024-04-22 LAB — POCT URINE DIPSTICK
Bilirubin, UA: NEGATIVE
Glucose, UA: NEGATIVE mg/dL
Ketones, POC UA: NEGATIVE mg/dL
Nitrite, UA: NEGATIVE
POC PROTEIN,UA: NEGATIVE
Spec Grav, UA: 1.01
Urobilinogen, UA: 0.2 U/dL
pH, UA: 6

## 2024-04-22 MED ORDER — SULFAMETHOXAZOLE-TRIMETHOPRIM 800-160 MG PO TABS: 1.0000 | ORAL_TABLET | Freq: Two times a day (BID) | ORAL | 0 refills | Status: AC

## 2024-04-22 NOTE — ED Provider Notes (Signed)
 " [facility]    CSN: [id number] Arrival date & time: 04/22/24  1109      History   Chief Complaint Chief Complaint  Patient presents with   Abdominal Pain    HPI Laura Stevens is a 69 y.o. female presenting for at least 2-week history of multiple episodes of watery diarrhea per day.  Over the past few days she has had about 5 episodes of watery diarrhea a day, lower abdominal cramping and nausea without vomiting.  Patient was treated in ED 04/09/24 for pyelonephritis (seen on CT abdomen imaging) with ceftriaxone  and cefdinir .  Patient took all antibiotics and states symptoms started around the time she began the antibiotics.  Patient also reports I recently started back burning again.  Reports frequency/urgency and dysuria.  Reports back pain but no flank pain, hematuria, vaginal discharge or odor.  No recent travel.  No other concerns.  HPI  Past Medical History:  Diagnosis Date   Renal disorder     Patient Active Problem List   Diagnosis Date Noted   Pain of right hip 12/26/2023   Hemorrhoids 10/01/2023   Skin fissure 10/01/2023   Osteopenia 08/04/2021   Primary osteoarthritis of both knees 05/07/2020    History reviewed. No pertinent surgical history.  OB History   No obstetric history on file.      Home Medications    Prior to Admission medications  Medication Sig Start Date End Date Taking? Authorizing Provider  ketorolac  (TORADOL ) 10 MG tablet Take 10 mg by mouth every 6 (six) hours as needed. 03/09/24  Yes [provider]  solifenacin (VESICARE) 5 MG tablet Take 5 mg by mouth daily. 03/10/24 03/10/25 Yes [provider]  FEROSUL 325 (65 Fe) MG tablet Take 325 mg by mouth 2 (two) times daily. 02/13/24   [provider]  lidocaine (LIDODERM) 5 % 1 patch daily. 02/11/24   [provider]  meloxicam (MOBIC) 7.5 MG tablet Take 7.5 mg by mouth. 02/04/24 02/03/25  [provider]  mirtazapine (REMERON)  7.5 MG tablet Take 7.5 mg by mouth at bedtime.    [provider]  oxyCODONE (OXY IR/ROXICODONE) 5 MG immediate release tablet Take 5 mg by mouth. 02/04/24   [provider]  tiZANidine (ZANAFLEX) 4 MG tablet  01/05/24   [provider]    Family History Family History  Problem Relation Age of Onset   Breast cancer Cousin        pat cousin    Social History Social History[1]   Allergies   Patient has no known allergies.   Review of Systems Review of Systems  Constitutional:  Negative for chills, fatigue and fever.  Gastrointestinal:  Positive for abdominal pain, diarrhea and nausea. Negative for blood in stool and vomiting.  Genitourinary:  Positive for dysuria, frequency and urgency. Negative for decreased urine volume, flank pain, hematuria, pelvic pain, vaginal bleeding, vaginal discharge and vaginal pain.  Musculoskeletal:  Positive for back pain.  Skin:  Negative for rash.     Physical Exam Triage Vital Signs ED Triage Vitals  Encounter Vitals Group     BP      Girls Systolic BP Percentile      Girls Diastolic BP Percentile      Boys Systolic BP Percentile      Boys Diastolic BP Percentile      Pulse      Resp      Temp      Temp src  SpO2      Weight      Height      Head Circumference      Peak Flow      Pain Score      Pain Loc      Pain Education      Exclude from Growth Chart    No data found.  Updated Vital Signs BP 114/77 (BP Location: Right Arm)   Pulse 82   Temp 99 F (37.2 C) (Oral)   Resp 14   Ht 5' 6 (1.676 m)   Wt 134 lb 0.6 oz (60.8 kg)   SpO2 100%   BMI 21.63 kg/m      Physical Exam Vitals and nursing note reviewed.  Constitutional:      General: She is not in acute distress.    Appearance: Normal appearance. She is not ill-appearing or toxic-appearing.  HENT:     Head: Normocephalic and atraumatic.  Eyes:     General: No scleral icterus.       Right eye: No discharge.        Left eye: No  discharge.     Conjunctiva/sclera: Conjunctivae normal.  Cardiovascular:     Rate and Rhythm: Normal rate and regular rhythm.     Heart sounds: Normal heart sounds.  Pulmonary:     Effort: Pulmonary effort is normal. No respiratory distress.     Breath sounds: Normal breath sounds.  Abdominal:     Palpations: Abdomen is soft.     Tenderness: There is abdominal tenderness (generalized lower abdomen). There is no right CVA tenderness or left CVA tenderness.  Musculoskeletal:     Cervical back: Neck supple.  Skin:    General: Skin is dry.  Neurological:     General: No focal deficit present.     Mental Status: She is alert. Mental status is at baseline.     Motor: No weakness.     Gait: Gait normal.  Psychiatric:        Mood and Affect: Mood normal.        Behavior: Behavior normal.      UC Treatments / Results  Labs (all labs ordered are listed, but only abnormal results are displayed) Labs Reviewed  POCT URINE DIPSTICK - Abnormal; Notable for the following components:      Result Value   Blood, UA trace-intact (*)    Leukocytes, UA Small (1+) (*)    All other components within normal limits  URINE CULTURE  C DIFFICILE QUICK SCREEN W PCR REFLEX      EKG   Radiology No results found.  Procedures Procedures (including critical care time)  Medications Ordered in UC Medications - No data to display  Initial Impression / Assessment and Plan / UC Course  I have reviewed the triage vital signs and the nursing notes.  Pertinent labs & imaging results that were available during my care of the patient were reviewed by me and considered in my medical decision making (see chart for details).   69 year old female presents for approximate 2-week history of 4-5 episodes of watery diarrhea per day with abdominal cramping.  Symptoms started after she began cefdinir  for acute pyelonephritis on 1//2026.  She believes the diarrhea has recently worsened.  She also reports burning  with urination that started back over the past couple of days.  No associated fever.  Vitals are all normal and stable and she is overall well-appearing.  No acute distress.  No CVA tenderness.  Abdomen soft with generalized lower abdominal tenderness.  UA today shows trace RBCs and small leuks.  Will send for culture.  Concern for C. difficile secondary to antibiotic use.   Suspected urinary tract infection with Bactrim  DS.  Will treat with oral Vancomycin  125 mg PO four times daily x 10 days if c diff positive.  This time encouraged increasing fluids and rest.  Advised avoiding antidiarrhea medications.  We reviewed ER precautions.  *** Final Clinical Impressions(s) / UC Diagnoses   Final diagnoses:  Lower abdominal pain  Urinary tract infection without hematuria, site unspecified     Discharge Instructions      - You have a UTI so I sent an antibiotic to the pharmacy.  Will call you and alter the antibiotic if needed once we get the culture back. - Also concern for possible C. difficile given recent cefdinir  antibiotic use.  We discussed that there are multiple antibiotics that can lead to an infection in your gut and this is one of them.  Stool sample was given today so we will test that and call you if we need to add more treatment in a few days.  At this time increase your fluid intake and rest. - Make a PCP follow-up. - Go to the ER if fever, worsening abdominal pain, dehydration or weakness.  UTI: Based on either symptoms or urinalysis, you may have a urinary tract infection. We will send the urine for culture and call with results in a few days. Begin antibiotics at this time. Your symptoms should be much improved over the next 2-3 days. Increase rest and fluid intake. If for some reason symptoms are worsening or not improving after a couple of days or the urine culture determines the antibiotics you are taking will not treat the infection, the antibiotics may be changed. Return  or go to ER for fever, back pain, worsening urinary pain, discharge, increased blood in urine. May take Tylenol or Motrin OTC for pain relief or consider AZO if no contraindications      ED Prescriptions   None    PDMP not reviewed this encounter.     [1]  Social History Tobacco Use   Smoking status: Never   Smokeless tobacco: Never  Vaping Use   Vaping status: Never Used  Substance Use Topics   Alcohol  use: Never   Drug use: Never   "

## 2024-04-22 NOTE — Discharge Instructions (Signed)
-   You have a UTI so I sent an antibiotic to the pharmacy.  Will call you and alter the antibiotic if needed once we get the culture back. - Also concern for possible C. difficile given recent cefdinir  antibiotic use.  We discussed that there are multiple antibiotics that can lead to an infection in your gut and this is one of them.  Stool sample was given today so we will test that and call you if we need to add more treatment in a few days.  At this time increase your fluid intake and rest. - Make a PCP follow-up. - Go to the ER if fever, worsening abdominal pain, dehydration or weakness.  UTI: Based on either symptoms or urinalysis, you may have a urinary tract infection. We will send the urine for culture and call with results in a few days. Begin antibiotics at this time. Your symptoms should be much improved over the next 2-3 days. Increase rest and fluid intake. If for some reason symptoms are worsening or not improving after a couple of days or the urine culture determines the antibiotics you are taking will not treat the infection, the antibiotics may be changed. Return or go to ER for fever, back pain, worsening urinary pain, discharge, increased blood in urine. May take Tylenol or Motrin OTC for pain relief or consider AZO if no contraindications   - You have C. difficile infection.  I sent an antibiotic to the pharmacy.  Start it right away.  Do not take any antidiarrhea medications and make sure you are increasing your fluids. - Follow-up with your primary care provider.

## 2024-04-22 NOTE — ED Triage Notes (Signed)
 Patient reports stomach pain and diarrhea that started yesterday.  Patient recently had a kidney stone removed.  Patient denies N/V. Patient reports some dysuria.

## 2024-04-23 ENCOUNTER — Telehealth: Payer: Self-pay | Admitting: Physician Assistant

## 2024-04-23 DIAGNOSIS — A0472 Enterocolitis due to Clostridium difficile, not specified as recurrent: Secondary | ICD-10-CM

## 2024-04-23 MED ORDER — VANCOMYCIN HCL 125 MG PO CAPS: 125.0000 mg | ORAL_CAPSULE | Freq: Four times a day (QID) | ORAL | 0 refills | Status: AC

## 2024-04-23 NOTE — Telephone Encounter (Signed)
 Discussed results of C. difficile testing which was positive for patient.  Sent vancomycin  to [facility] pharmacy and provided patient with a GoodRx coupon for $45.  Discussed that this is a highly contagious disease and reviewed ways to reduce the risk of spreading it.  Reviewed going to ED if fever, abdominal pain, dehydration or weakness.  Otherwise, advised her to follow-up with her primary care provider.

## 2024-04-24 ENCOUNTER — Ambulatory Visit (HOSPITAL_COMMUNITY): Payer: Self-pay

## 2024-04-25 LAB — URINE CULTURE: Culture: 30000 — AB

## 2024-04-28 MED ORDER — CIPROFLOXACIN HCL 500 MG PO TABS: 250.0000 mg | ORAL_TABLET | Freq: Two times a day (BID) | ORAL | 0 refills | Status: AC

## 2024-04-28 NOTE — Addendum Note (Signed)
 Addended by: ARNALDO ALFONSO RAMAN on: 04/28/2024 02:05 PM   Modules accepted: Orders

## 2024-05-01 ENCOUNTER — Ambulatory Visit: Admitting: Urology
